# Patient Record
Sex: Female | Born: 1965
Health system: Southern US, Community
[De-identification: ages and names within clinical notes are randomized; demographics above are authoritative.]

## PROBLEM LIST (undated history)

## (undated) DIAGNOSIS — F419 Anxiety disorder, unspecified: Secondary | ICD-10-CM

---

## 1998-09-13 ENCOUNTER — Other Ambulatory Visit: Admission: RE | Admit: 1998-09-13 | Discharge: 1998-09-13 | Payer: Self-pay | Admitting: Obstetrics and Gynecology

## 1998-10-31 ENCOUNTER — Emergency Department (HOSPITAL_COMMUNITY): Admission: EM | Admit: 1998-10-31 | Discharge: 1998-10-31 | Payer: Self-pay | Admitting: Internal Medicine

## 2001-07-24 ENCOUNTER — Emergency Department (HOSPITAL_COMMUNITY): Admission: EM | Admit: 2001-07-24 | Discharge: 2001-07-24 | Payer: Self-pay | Admitting: Emergency Medicine

## 2002-04-30 ENCOUNTER — Other Ambulatory Visit: Admission: RE | Admit: 2002-04-30 | Discharge: 2002-04-30 | Payer: Self-pay | Admitting: Obstetrics and Gynecology

## 2002-07-29 ENCOUNTER — Encounter (INDEPENDENT_AMBULATORY_CARE_PROVIDER_SITE_OTHER): Payer: Self-pay | Admitting: *Deleted

## 2002-07-29 ENCOUNTER — Ambulatory Visit (HOSPITAL_COMMUNITY): Admission: RE | Admit: 2002-07-29 | Discharge: 2002-07-29 | Payer: Self-pay | Admitting: Obstetrics and Gynecology

## 2003-09-06 ENCOUNTER — Emergency Department (HOSPITAL_COMMUNITY): Admission: EM | Admit: 2003-09-06 | Discharge: 2003-09-06 | Payer: Self-pay | Admitting: Emergency Medicine

## 2004-05-10 ENCOUNTER — Other Ambulatory Visit: Admission: RE | Admit: 2004-05-10 | Discharge: 2004-05-10 | Payer: Self-pay | Admitting: Obstetrics and Gynecology

## 2005-05-26 ENCOUNTER — Other Ambulatory Visit: Admission: RE | Admit: 2005-05-26 | Discharge: 2005-05-26 | Payer: Self-pay | Admitting: Obstetrics and Gynecology

## 2005-06-29 ENCOUNTER — Ambulatory Visit: Payer: Self-pay | Admitting: Internal Medicine

## 2005-07-14 ENCOUNTER — Ambulatory Visit: Payer: Self-pay | Admitting: Internal Medicine

## 2005-07-19 ENCOUNTER — Ambulatory Visit: Payer: Self-pay | Admitting: Cardiology

## 2007-03-04 ENCOUNTER — Emergency Department (HOSPITAL_COMMUNITY): Admission: EM | Admit: 2007-03-04 | Discharge: 2007-03-05 | Payer: Self-pay | Admitting: Emergency Medicine

## 2008-01-22 ENCOUNTER — Encounter: Admission: RE | Admit: 2008-01-22 | Discharge: 2008-02-17 | Payer: Self-pay

## 2008-12-21 ENCOUNTER — Ambulatory Visit: Payer: Self-pay | Admitting: Internal Medicine

## 2010-05-18 ENCOUNTER — Encounter (INDEPENDENT_AMBULATORY_CARE_PROVIDER_SITE_OTHER): Payer: Self-pay | Admitting: *Deleted

## 2010-10-01 ENCOUNTER — Encounter: Payer: Self-pay | Admitting: Obstetrics and Gynecology

## 2010-10-11 NOTE — Letter (Signed)
Summary: Pre Visit Letter Revised  [facility]  [address] [place], [place] [address]   Phone: [phone]  Fax: [phone]        05/18/2010 MRN: [id number]   [facility] [address] [place], [place]  [address]              Welcome to the Gastroenterology Division at [facility].    You are scheduled to see a nurse for your pre-procedure visit on June 02, 2010 at 11:00am on the 3rd floor at [facility], [address]  We ask that you try to arrive at our office 15 minutes prior to your appointment time to allow for check-in.  Please take a minute to review the attached form.  If you answer "Yes" to one or more of the questions on the first page, we ask that you call the person listed at your earliest opportunity.  If you answer "No" to all of the questions, please complete the rest of the form and bring it to your appointment.    Your nurse visit will consist of discussing your medical and surgical history, your immediate family medical history, and your medications.   If you are unable to list all of your medications on the form, please bring the medication bottles to your appointment and we will list them.  We will need to be aware of both prescribed and over the counter drugs.  We will need to know exact dosage information as well.    Please be prepared to read and sign documents such as consent forms, a financial agreement, and acknowledgement forms.  If necessary, and with your consent, a friend or relative is welcome to sit-in on the nurse visit with you.  Please bring your insurance card so that we may make a copy of it.  If your insurance requires a referral to see a specialist, please bring your referral form from your primary care physician.  No co-pay is required for this nurse visit.     If you cannot keep your appointment, please call [phone] to cancel or reschedule prior to your appointment date.  This allows [place] the  opportunity to schedule an appointment for another patient in need of care.    Thank you for choosing [facility] for your medical needs.  We appreciate the opportunity to care for you.  Please visit [place] at our website  to learn more about our practice.  Sincerely, The Gastroenterology Division

## 2010-11-07 ENCOUNTER — Ambulatory Visit (INDEPENDENT_AMBULATORY_CARE_PROVIDER_SITE_OTHER): Payer: BC Managed Care – PPO | Admitting: Family Medicine

## 2010-11-07 DIAGNOSIS — M25559 Pain in unspecified hip: Secondary | ICD-10-CM

## 2011-01-06 ENCOUNTER — Encounter (INDEPENDENT_AMBULATORY_CARE_PROVIDER_SITE_OTHER): Payer: BC Managed Care – PPO | Admitting: *Deleted

## 2011-01-06 DIAGNOSIS — R5383 Other fatigue: Secondary | ICD-10-CM

## 2011-01-06 DIAGNOSIS — Z0000 Encounter for general adult medical examination without abnormal findings: Secondary | ICD-10-CM

## 2011-01-06 DIAGNOSIS — R609 Edema, unspecified: Secondary | ICD-10-CM

## 2011-01-06 DIAGNOSIS — R5381 Other malaise: Secondary | ICD-10-CM

## 2011-01-06 DIAGNOSIS — E559 Vitamin D deficiency, unspecified: Secondary | ICD-10-CM

## 2011-01-27 NOTE — Op Note (Signed)
   NAME:  Deborah [name], Deborah [name]                        ACCOUNT NO.:  [id number]   MEDICAL RECORD NO.:  [id number]                   PATIENT TYPE:  AMB   LOCATION:  SDC                                  FACILITY:  [facility]   PHYSICIAN:  Juluis Mire, M.D.                DATE OF BIRTH:  1965-11-13   DATE OF PROCEDURE:  07/29/2002  DATE OF DISCHARGE:                                 OPERATIVE REPORT   PREOPERATIVE DIAGNOSES:  1. Abnormal uterine bleeding.  2. Endometrial polyp.   POSTOPERATIVE DIAGNOSES:  1. Abnormal uterine bleeding.  2. Endometrial polyp.   OPERATIVE PROCEDURE:  1. Cervical dilatation.  2. Hysteroscopy.  3. Resection of polyp.  4. Multiple endometrial biopsies.   SURGEON:  Juluis Mire, M.D.   ANESTHESIA:  General using the LMA.   ESTIMATED BLOOD LOSS:  Minimal.   PACKS AND DRAINS:  None.   INTRAOPERATIVE BLOOD PLACED:  None.   COMPLICATIONS:  None.   INDICATIONS:  Dictated in history and physical.   PROCEDURE AS FOLLOWS:  The patient taken to the OR and placed in supine  position.  After [name] satisfactory level of general anesthesia patient's legs  in [name] dorsal lithotomy position using Allen stirrups, the perineum and vagina  were prepped out with Betadine and draped in [name] sterile field.  Speculum was  placed in the vaginal vault and cervix grasped with single tooth tenaculum.  Uterus sounded to 8 cm.  Cervix serially dilated to [name] size 35 Pratt dilator.  Operative hysteroscope was introduced and intrauterine cavity was distended  using sorbitol.  Visualization revealed probably posteriorly what was [name]  polyp, but had been somewhat disrupted by dilation.  This area was resected  using [name] single loop.  Multiple endometrial biopsies were obtained from the  anterior, posterior, and lateral walls.  No other endometrial pathology was  noted.  There was no signs of perforation or active bleeding.  Single tooth  tenaculum and  speculum then removed.  The patient taken out  of the dorsal lithotomy  position.  Once alert and extubated, transferred to recovery room in good  condition.  Sponge, instrument, needle count reported as correct by  circulating nurse x2.                                               Juluis Mire, M.D.    JSM/MEDQ  D:  07/29/2002  T:  07/29/2002  Job:  [id number]

## 2011-01-27 NOTE — H&P (Signed)
NAME:  Deborah [name], Deborah [name]                        ACCOUNT NO.:  [id number]   MEDICAL RECORD NO.:  [id number]                   PATIENT TYPE:  AMB   LOCATION:  SDC                                  [facility]   PHYSICIAN:  Juluis Mire, M.D.                DATE OF BIRTH:  07-Apr-1966   DATE OF ADMISSION:  07/29/2002  DATE OF DISCHARGE:                                HISTORY & PHYSICAL   HISTORY OF PRESENT ILLNESS:  The patient is [name] 45 year old gravida 2, para 2,  married white female who presents for [name] hysteroscopy with resectoscope.   In relation to the present admission, the patient has been having trouble  with oligomenorrhea.  She presented for saline infusion ultrasound to rule  out endometrial pathology.  [name] large polyp was noted.  She now presents for [name]  hysteroscopic resection.   ALLERGIES:  No known drug allergies.   MEDICATIONS:  Prozac.   PAST MEDICAL HISTORY:  Usual childhood diseases without any significant  sequelae.   PAST SURGICAL HISTORY:  Previous laparoscopic bilateral tubal ligation.  There was no evidence of pelvic pathology at that time.   OBSTETRICAL HISTORY:  She has had two spontaneous vaginal deliveries.   FAMILY HISTORY:  Noncontributory.   SOCIAL HISTORY:  No tobacco or alcohol use.   REVIEW OF SYMPTOMS:  Noncontributory.   PHYSICAL EXAMINATION:  VITAL SIGNS:  The patient is afebrile with stable  vital signs.  HEENT:  The patient is normocephalic.  Pupils equal, round, reactive to  light and accommodation.  Extraocular movements were intact.  Sclerae and  conjunctivae clear.  Oropharynx clear.  NECK:  Without thyromegaly.  BREASTS:  No discrete masses.  LUNGS:  Clear.  CARDIOVASCULAR:  Regular rate and rhythm, no murmurs, rubs, or gallops.  ABDOMEN:  Benign, no masses, organomegaly, or tenderness.  PELVIC:  Normal external genitalia.  Vaginal mucosa clear.  Cervix is  unremarkable.  Uterus is normal size, shape, and contour.  Adnexa are  free  of masses or tenderness.  EXTREMITIES:  Trace edema.  NEUROLOGIC:  Grossly within normal limits.   IMPRESSION:  Abnormal uterine bleeding with evidence of endometrial polyp.   PLAN:  The patient will undergo hysteroscopic evaluation and resection.  The  risks of surgery have been discussed, including the risk of anesthesia.  The  risk of infection.  The risk of vascular injury which could lead to  hemorrhage requiring transfusion or possible hysterectomy.  The risk of  uterine perforation which could lead to injury to bowel requiring  exploratory surgery.  The risk of deep vein thrombosis and pulmonary  embolus.  The patient voiced understanding of indications and risks.  Juluis Mire, M.D.    JSM/MEDQ  D:  07/29/2002  T:  07/29/2002  Job:  [id number]

## 2011-06-28 LAB — URINALYSIS, ROUTINE W REFLEX MICROSCOPIC
Ketones, ur: 15 — AB
Nitrite: POSITIVE — AB
Protein, ur: NEGATIVE

## 2011-06-28 LAB — WET PREP, GENITAL: Trich, Wet Prep: NONE SEEN

## 2011-06-28 LAB — BASIC METABOLIC PANEL
BUN: 14
Chloride: 106
Glucose, Bld: 97
Potassium: 3.3 — ABNORMAL LOW

## 2011-06-28 LAB — DIFFERENTIAL
Eosinophils Absolute: 0.2
Eosinophils Relative: 2
Lymphs Abs: 2.2
Monocytes Absolute: 1 — ABNORMAL HIGH

## 2011-06-28 LAB — CBC
HCT: 36.1
MCV: 90.1
Platelets: 265
WBC: 10.3

## 2011-06-28 LAB — GC/CHLAMYDIA PROBE AMP, GENITAL: GC Probe Amp, Genital: NEGATIVE

## 2011-06-28 LAB — PREGNANCY, URINE: Preg Test, Ur: NEGATIVE

## 2011-06-28 LAB — URINE MICROSCOPIC-ADD ON

## 2011-12-27 ENCOUNTER — Emergency Department (HOSPITAL_COMMUNITY): Payer: BC Managed Care – PPO

## 2011-12-27 ENCOUNTER — Emergency Department (HOSPITAL_COMMUNITY)
Admission: EM | Admit: 2011-12-27 | Discharge: 2011-12-27 | Disposition: A | Discharge status: Home / Self Care | Payer: BC Managed Care – PPO | Attending: Emergency Medicine | Admitting: Emergency Medicine

## 2011-12-27 ENCOUNTER — Encounter (HOSPITAL_COMMUNITY): Payer: Self-pay

## 2011-12-27 DIAGNOSIS — R51 Headache: Secondary | ICD-10-CM | POA: Insufficient documentation

## 2011-12-27 DIAGNOSIS — R1013 Epigastric pain: Secondary | ICD-10-CM

## 2011-12-27 DIAGNOSIS — R110 Nausea: Secondary | ICD-10-CM | POA: Insufficient documentation

## 2011-12-27 HISTORY — DX: Anxiety disorder, unspecified: F41.9

## 2011-12-27 LAB — DIFFERENTIAL
Basophils Absolute: 0.1 10*3/uL (ref 0.0–0.1)
Basophils Relative: 1 % (ref 0–1)
Eosinophils Absolute: 0.2 10*3/uL (ref 0.0–0.7)
Neutro Abs: 3.4 10*3/uL (ref 1.7–7.7)
Neutrophils Relative %: 51 % (ref 43–77)

## 2011-12-27 LAB — URINALYSIS, ROUTINE W REFLEX MICROSCOPIC
Glucose, UA: NEGATIVE mg/dL
Leukocytes, UA: NEGATIVE
Nitrite: NEGATIVE
Protein, ur: NEGATIVE mg/dL
Urobilinogen, UA: 1 mg/dL (ref 0.0–1.0)

## 2011-12-27 LAB — COMPREHENSIVE METABOLIC PANEL
AST: 15 U/L (ref 0–37)
Albumin: 3.6 g/dL (ref 3.5–5.2)
Alkaline Phosphatase: 71 U/L (ref 39–117)
Chloride: 110 mEq/L (ref 96–112)
Potassium: 3.6 mEq/L (ref 3.5–5.1)
Total Bilirubin: 0.2 mg/dL — ABNORMAL LOW (ref 0.3–1.2)
Total Protein: 6.7 g/dL (ref 6.0–8.3)

## 2011-12-27 LAB — LIPASE, BLOOD: Lipase: 27 U/L (ref 11–59)

## 2011-12-27 LAB — CBC
MCH: 30.8 pg (ref 26.0–34.0)
MCHC: 32.7 g/dL (ref 30.0–36.0)
Platelets: 250 10*3/uL (ref 150–400)
RDW: 14 % (ref 11.5–15.5)

## 2011-12-27 LAB — PREGNANCY, URINE: Preg Test, Ur: NEGATIVE

## 2011-12-27 MED ORDER — PANTOPRAZOLE SODIUM 20 MG PO TBEC: 20.0000 mg | DELAYED_RELEASE_TABLET | Freq: Every day | ORAL | Status: DC

## 2011-12-27 MED ORDER — FAMOTIDINE IN NACL 20-0.9 MG/50ML-% IV SOLN
20.0000 mg | Freq: Once | INTRAVENOUS | Status: AC
  Administered 2011-12-27: 20 mg via INTRAVENOUS
  Filled 2011-12-27: qty 50

## 2011-12-27 MED ORDER — FAMOTIDINE 20 MG PO TABS: 20.0000 mg | ORAL_TABLET | Freq: Two times a day (BID) | ORAL | Status: DC

## 2011-12-27 MED ORDER — SODIUM CHLORIDE 0.9 % IV SOLN
Freq: Once | INTRAVENOUS | Status: AC
  Administered 2011-12-27: 07:00:00 via INTRAVENOUS

## 2011-12-27 MED ORDER — DEXTROSE 50 % IV SOLN
1.0000 | Freq: Once | INTRAVENOUS | Status: AC
Start: 2011-12-27 — End: 2011-12-27
  Administered 2011-12-27: 50 mL via INTRAVENOUS
  Filled 2011-12-27: qty 50

## 2011-12-27 MED ORDER — ONDANSETRON 8 MG PO TBDP: 8.0000 mg | ORAL_TABLET | Freq: Three times a day (TID) | ORAL | Status: AC | PRN

## 2011-12-27 MED ORDER — IOHEXOL 300 MG/ML  SOLN
100.0000 mL | Freq: Once | INTRAMUSCULAR | Status: AC | PRN
  Administered 2011-12-27: 100 mL via INTRAVENOUS

## 2011-12-27 MED ORDER — ACETAMINOPHEN 325 MG PO TABS
650.0000 mg | ORAL_TABLET | Freq: Once | ORAL | Status: AC
  Administered 2011-12-27: 650 mg via ORAL
  Filled 2011-12-27: qty 2

## 2011-12-27 MED ORDER — ONDANSETRON HCL 4 MG/2ML IJ SOLN
4.0000 mg | Freq: Once | INTRAMUSCULAR | Status: AC
  Administered 2011-12-27: 4 mg via INTRAVENOUS
  Filled 2011-12-27: qty 2

## 2011-12-27 MED ORDER — GI COCKTAIL ~~LOC~~
30.0000 mL | Freq: Once | ORAL | Status: AC
  Administered 2011-12-27: 30 mL via ORAL
  Filled 2011-12-27: qty 30

## 2011-12-27 NOTE — ED Notes (Signed)
Pt complains of a headache and nausea, worse in the evenings for about one week

## 2011-12-27 NOTE — Discharge Instructions (Signed)
Your labs and CT scan were both normal today. Make sure to avoid fatty and spicy foods. Avoid NSAID medications, alcohol, caffeinated beverages. Take pepcid and protonix as prescirbed for possible reflux. Take zofran for nausea as prescribed as needed. Follow up with gastroenterology for further testing if symptoms continue.   Gastroesophageal Reflux Disease, Adult Gastroesophageal reflux disease (GERD) happens when acid from your stomach flows up into the esophagus. When acid comes in contact with the esophagus, the acid causes soreness (inflammation) in the esophagus. Over time, GERD may create small holes (ulcers) in the lining of the esophagus. CAUSES   Increased body weight. This puts pressure on the stomach, making acid rise from the stomach into the esophagus.   Smoking. This increases acid production in the stomach.   Drinking alcohol. This causes decreased pressure in the lower esophageal sphincter (valve or ring of muscle between the esophagus and stomach), allowing acid from the stomach into the esophagus.   Late evening meals and a full stomach. This increases pressure and acid production in the stomach.   A malformed lower esophageal sphincter.  Sometimes, no cause is found. SYMPTOMS   Burning pain in the lower part of the mid-chest behind the breastbone and in the mid-stomach area. This may occur twice a week or more often.   Trouble swallowing.   Sore throat.   Dry cough.   Asthma-like symptoms including chest tightness, shortness of breath, or wheezing.  DIAGNOSIS  Your caregiver may be able to diagnose GERD based on your symptoms. In some cases, X-rays and other tests may be done to check for complications or to check the condition of your stomach and esophagus. TREATMENT  Your caregiver may recommend over-the-counter or prescription medicines to help decrease acid production. Ask your caregiver before starting or adding any new medicines.  HOME CARE INSTRUCTIONS    Change the factors that you can control. Ask your caregiver for guidance concerning weight loss, quitting smoking, and alcohol consumption.   Avoid foods and drinks that make your symptoms worse, such as:   Caffeine or alcoholic drinks.   Chocolate.   Peppermint or mint flavorings.   Garlic and onions.   Spicy foods.   Citrus fruits, such as oranges, lemons, or limes.   Tomato-based foods such as sauce, chili, salsa, and pizza.   Fried and fatty foods.   Avoid lying down for the 3 hours prior to your bedtime or prior to taking a nap.   Eat small, frequent meals instead of large meals.   Wear loose-fitting clothing. Do not wear anything tight around your waist that causes pressure on your stomach.   Raise the head of your bed 6 to 8 inches with wood blocks to help you sleep. Extra pillows will not help.   Only take over-the-counter or prescription medicines for pain, discomfort, or fever as directed by your caregiver.   Do not take aspirin, ibuprofen, or other nonsteroidal anti-inflammatory drugs (NSAIDs).  SEEK IMMEDIATE MEDICAL CARE IF:   You have pain in your arms, neck, jaw, teeth, or back.   Your pain increases or changes in intensity or duration.   You develop nausea, vomiting, or sweating (diaphoresis).   You develop shortness of breath, or you faint.   Your vomit is green, yellow, black, or looks like coffee grounds or blood.   Your stool is red, bloody, or black.  These symptoms could be signs of other problems, such as heart disease, gastric bleeding, or esophageal bleeding. MAKE SURE YOU:  Understand these instructions.   Will watch your condition.   Will get help right away if you are not doing well or get worse.  Document Released: 06/07/2005 Document Revised: 08/17/2011 Document Reviewed: 03/17/2011 [facility] Patient Information 2012 [place], [place].

## 2011-12-27 NOTE — ED Provider Notes (Signed)
History     CSN: [id number]  Arrival date & time 12/27/11  4098   First MD Initiated Contact with Patient 12/27/11 [phone]      Chief Complaint  Patient presents with  . Nausea  . Headache    (Consider location/radiation/quality/duration/timing/severity/associated sxs/prior treatment) Patient is a 46 y.o. female presenting with abdominal pain. The history is provided by the patient.  Abdominal Pain The primary symptoms of the illness include abdominal pain and nausea. The primary symptoms of the illness do not include fever, fatigue, shortness of breath, vomiting, diarrhea, hematemesis, dysuria or vaginal bleeding. The current episode started more than 2 days ago.  Symptoms associated with the illness do not include chills, constipation, frequency or back pain.  Pt reports nausea and epigastric discomfort for the last month, worse at night time and gradually improving through the day. States she usually eats dinner around 6pm, goes to bed at 9, and states symptoms begin around 10pm and last all night waking her up from sleep constantly. Pt states she is not vomiting. Denies fever, chills, malaise. States now having a headache as well, hx of migraines. No chest pain or SOB. No previous work up. Did not take any medications for this.   Past Medical History  Diagnosis Date  . Migraine   . Anxiety     History reviewed. No pertinent past surgical history.  History reviewed. No pertinent family history.  History  Substance Use Topics  . Smoking status: Not on file  . Smokeless tobacco: Not on file  . Alcohol Use: No    OB History    Grav Para Term Preterm Abortions TAB SAB Ect Mult Living                  Review of Systems  Constitutional: Negative for fever, chills and fatigue.  Eyes: Negative.   Respiratory: Negative for chest tightness and shortness of breath.   Cardiovascular: Negative.   Gastrointestinal: Positive for nausea and abdominal pain. Negative for vomiting,  diarrhea, constipation, blood in stool and hematemesis.  Genitourinary: Negative for dysuria, frequency, flank pain, vaginal bleeding, menstrual problem and pelvic pain.  Musculoskeletal: Negative for back pain.  Skin: Negative.   Neurological: Positive for headaches.  Psychiatric/Behavioral: Negative.     Allergies  Review of patient's allergies indicates no known allergies.  Home Medications   Current Outpatient Rx  Name Route Sig Dispense Refill  . ALPRAZOLAM 0.25 MG PO TABS Oral Take 0.25 mg by mouth at bedtime as needed.    Marland Kitchen CITALOPRAM HYDROBROMIDE 10 MG PO TABS Oral Take 10 mg by mouth daily.    . TOPIRAMATE 100 MG PO TABS Oral Take 300 mg by mouth daily.       BP 117/72  Pulse 69  Temp(Src) 98.7 F (37.1 C) (Oral)  Resp 19  Ht 5\' 6"  (1.676 m)  Wt 170 lb 9.6 oz (77.384 kg)  BMI 27.54 kg/m2  SpO2 100%  LMP 12/27/2011  Physical Exam  Nursing note and vitals reviewed. Constitutional: She appears well-developed and well-nourished.       Uncomfortable appearing  HENT:  Head: Normocephalic and atraumatic.  Neck: Neck supple.  Cardiovascular: Normal rate, regular rhythm and normal heart sounds.   Pulmonary/Chest: Effort normal. No respiratory distress.  Abdominal: Soft. Bowel sounds are normal. She exhibits no distension.       Epigastric and LUQ tenderness. No guarding, no rebound tenderness  Musculoskeletal: Normal range of motion.  Neurological: She is alert.  Skin: Skin  is warm and dry.  Psychiatric: She has a normal mood and affect.    ED Course  Procedures (including critical care time)  PT with nausea for now a month. Epigastric discomfort.Pt belching in ED. Will get labs. Nausea meds and GI cocktail ordered Results for orders placed during the hospital encounter of 12/27/11  URINALYSIS, ROUTINE W REFLEX MICROSCOPIC      Component Value Range   Color, Urine YELLOW  YELLOW    APPearance CLEAR  CLEAR    Specific Gravity, Urine 1.013  1.005 - 1.030    pH  7.0  5.0 - 8.0    Glucose, UA NEGATIVE  NEGATIVE (mg/dL)   Hgb urine dipstick NEGATIVE  NEGATIVE    Bilirubin Urine NEGATIVE  NEGATIVE    Ketones, ur NEGATIVE  NEGATIVE (mg/dL)   Protein, ur NEGATIVE  NEGATIVE (mg/dL)   Urobilinogen, UA 1.0  0.0 - 1.0 (mg/dL)   Nitrite NEGATIVE  NEGATIVE    Leukocytes, UA NEGATIVE  NEGATIVE   PREGNANCY, URINE      Component Value Range   Preg Test, Ur NEGATIVE  NEGATIVE   CBC      Component Value Range   WBC 6.6  4.0 - 10.5 (K/uL)   RBC 4.28  3.87 - 5.11 (MIL/uL)   Hemoglobin 13.2  12.0 - 15.0 (g/dL)   HCT 91.4  78.2 - 95.6 (%)   MCV 94.4  78.0 - 100.0 (fL)   MCH 30.8  26.0 - 34.0 (pg)   MCHC 32.7  30.0 - 36.0 (g/dL)   RDW 21.3  08.6 - 57.8 (%)   Platelets 250  150 - 400 (K/uL)  DIFFERENTIAL      Component Value Range   Neutrophils Relative 51  43 - 77 (%)   Neutro Abs 3.4  1.7 - 7.7 (K/uL)   Lymphocytes Relative 32  12 - 46 (%)   Lymphs Abs 2.1  0.7 - 4.0 (K/uL)   Monocytes Relative 13 (*) 3 - 12 (%)   Monocytes Absolute 0.9  0.1 - 1.0 (K/uL)   Eosinophils Relative 3  0 - 5 (%)   Eosinophils Absolute 0.2  0.0 - 0.7 (K/uL)   Basophils Relative 1  0 - 1 (%)   Basophils Absolute 0.1  0.0 - 0.1 (K/uL)  COMPREHENSIVE METABOLIC PANEL      Component Value Range   Sodium 140  135 - 145 (mEq/L)   Potassium 3.6  3.5 - 5.1 (mEq/L)   Chloride 110  96 - 112 (mEq/L)   CO2 21  19 - 32 (mEq/L)   Glucose, Bld 69 (*) 70 - 99 (mg/dL)   BUN 13  6 - 23 (mg/dL)   Creatinine, Ser 4.69  0.50 - 1.10 (mg/dL)   Calcium 8.8  8.4 - 62.9 (mg/dL)   Total Protein 6.7  6.0 - 8.3 (g/dL)   Albumin 3.6  3.5 - 5.2 (g/dL)   AST 15  0 - 37 (U/L)   ALT 12  0 - 35 (U/L)   Alkaline Phosphatase 71  39 - 117 (U/L)   Total Bilirubin 0.2 (*) 0.3 - 1.2 (mg/dL)   GFR calc non Af Amer 83 (*) >90 (mL/min)   GFR calc Af Amer >90  >90 (mL/min)  LIPASE, BLOOD      Component Value Range   Lipase 27  11 - 59 (U/L)    No significant lab findings to explain pt's symptoms.  Discussed With Dr.Campos. Given symptoms for so long, and pt appears  so uncomfortable, will get CT abd/pelvis to evaluate for possible GI and pancreatic abnormalities.    Ct Abdomen Pelvis W Contrast  12/27/2011  *RADIOLOGY REPORT*  Clinical Data: Nausea and epigastric pain.  CT ABDOMEN AND PELVIS WITH CONTRAST  Technique:  Multidetector CT imaging of the abdomen and pelvis was performed following the standard protocol during bolus administration of intravenous contrast.  Contrast: OMNIPAQUE IOHEXOL 300 MG/ML  SOLN  Comparison: 03/05/2007.  Findings: Lung bases are clear.  Heart size normal.  No pericardial or pleural effusion.  Liver, gallbladder, adrenal glands, kidneys, spleen, pancreas, stomach and bowel are unremarkable.  Appendix is normal.  No pathologically enlarged lymph nodes.  No free fluid.  Uterus and ovaries are visualized.  No worrisome lytic or sclerotic lesions.  IMPRESSION: Negative.  Original Report Authenticated By: Reyes Ivan, M.D.   9:04 AM Pt reassessed. Abdomen continues to be soft. No guarding. Epigastric discomfort on palpation. CT abdomen negative. Pt feeling better. Will start on PPIs, follow up with GI.    No results found.   1. Nausea alone   2. Epigastric pain       MDM          Lottie Mussel, PA 12/27/11 1549

## 2011-12-27 NOTE — ED Notes (Signed)
Discharge instructions reviewed w/ pt., verbalizes understanding. Three prescriptions provided at discharge. 

## 2011-12-28 NOTE — ED Provider Notes (Signed)
Medical screening examination/treatment/procedure(s) were performed by non-physician practitioner and as supervising physician I was immediately available for consultation/collaboration.   Lyanne Co, MD 12/28/11 [phone]

## 2014-04-14 ENCOUNTER — Other Ambulatory Visit: Payer: Self-pay | Admitting: Family Medicine

## 2014-04-14 ENCOUNTER — Ambulatory Visit
Admission: RE | Admit: 2014-04-14 | Discharge: 2014-04-14 | Disposition: A | Discharge status: Home / Self Care | Payer: BC Managed Care – PPO | Source: Ambulatory Visit | Attending: Family Medicine | Admitting: Family Medicine

## 2014-04-14 DIAGNOSIS — M25551 Pain in right hip: Secondary | ICD-10-CM

## 2016-04-14 DIAGNOSIS — R5383 Other fatigue: Secondary | ICD-10-CM | POA: Diagnosis not present

## 2016-04-14 DIAGNOSIS — R351 Nocturia: Secondary | ICD-10-CM | POA: Diagnosis not present

## 2016-04-14 DIAGNOSIS — R52 Pain, unspecified: Secondary | ICD-10-CM | POA: Diagnosis not present

## 2016-04-14 DIAGNOSIS — R635 Abnormal weight gain: Secondary | ICD-10-CM | POA: Diagnosis not present

## 2016-04-27 DIAGNOSIS — M542 Cervicalgia: Secondary | ICD-10-CM | POA: Diagnosis not present

## 2016-04-27 DIAGNOSIS — M791 Myalgia: Secondary | ICD-10-CM | POA: Diagnosis not present

## 2016-04-27 DIAGNOSIS — G518 Other disorders of facial nerve: Secondary | ICD-10-CM | POA: Diagnosis not present

## 2016-04-27 DIAGNOSIS — G43019 Migraine without aura, intractable, without status migrainosus: Secondary | ICD-10-CM | POA: Diagnosis not present

## 2016-04-28 DIAGNOSIS — M722 Plantar fascial fibromatosis: Secondary | ICD-10-CM | POA: Diagnosis not present

## 2016-04-28 DIAGNOSIS — M71571 Other bursitis, not elsewhere classified, right ankle and foot: Secondary | ICD-10-CM | POA: Diagnosis not present

## 2016-05-05 DIAGNOSIS — M71571 Other bursitis, not elsewhere classified, right ankle and foot: Secondary | ICD-10-CM | POA: Diagnosis not present

## 2016-05-05 DIAGNOSIS — M722 Plantar fascial fibromatosis: Secondary | ICD-10-CM | POA: Diagnosis not present

## 2016-08-11 DIAGNOSIS — M545 Low back pain: Secondary | ICD-10-CM | POA: Diagnosis not present

## 2016-10-11 DIAGNOSIS — Z1231 Encounter for screening mammogram for malignant neoplasm of breast: Secondary | ICD-10-CM | POA: Diagnosis not present

## 2016-10-11 DIAGNOSIS — Z6838 Body mass index (BMI) 38.0-38.9, adult: Secondary | ICD-10-CM | POA: Diagnosis not present

## 2016-10-11 DIAGNOSIS — Z01419 Encounter for gynecological examination (general) (routine) without abnormal findings: Secondary | ICD-10-CM | POA: Diagnosis not present

## 2016-10-12 ENCOUNTER — Other Ambulatory Visit: Payer: Self-pay | Admitting: Obstetrics

## 2016-10-12 ENCOUNTER — Other Ambulatory Visit: Payer: Self-pay | Admitting: Obstetrics and Gynecology

## 2016-10-12 DIAGNOSIS — N63 Unspecified lump in unspecified breast: Secondary | ICD-10-CM

## 2016-10-16 ENCOUNTER — Other Ambulatory Visit: Payer: Self-pay

## 2016-10-17 DIAGNOSIS — E079 Disorder of thyroid, unspecified: Secondary | ICD-10-CM | POA: Diagnosis not present

## 2016-10-17 DIAGNOSIS — R42 Dizziness and giddiness: Secondary | ICD-10-CM | POA: Diagnosis not present

## 2016-10-17 DIAGNOSIS — F331 Major depressive disorder, recurrent, moderate: Secondary | ICD-10-CM | POA: Diagnosis not present

## 2016-10-17 DIAGNOSIS — Z6838 Body mass index (BMI) 38.0-38.9, adult: Secondary | ICD-10-CM | POA: Diagnosis not present

## 2016-10-19 DIAGNOSIS — N6320 Unspecified lump in the left breast, unspecified quadrant: Secondary | ICD-10-CM | POA: Diagnosis not present

## 2016-11-03 DIAGNOSIS — Z79899 Other long term (current) drug therapy: Secondary | ICD-10-CM | POA: Diagnosis not present

## 2016-11-03 DIAGNOSIS — Z0000 Encounter for general adult medical examination without abnormal findings: Secondary | ICD-10-CM | POA: Diagnosis not present

## 2016-11-07 DIAGNOSIS — Z0000 Encounter for general adult medical examination without abnormal findings: Secondary | ICD-10-CM | POA: Diagnosis not present

## 2016-11-07 DIAGNOSIS — F331 Major depressive disorder, recurrent, moderate: Secondary | ICD-10-CM | POA: Diagnosis not present

## 2016-11-07 DIAGNOSIS — E782 Mixed hyperlipidemia: Secondary | ICD-10-CM | POA: Diagnosis not present

## 2016-11-07 DIAGNOSIS — E079 Disorder of thyroid, unspecified: Secondary | ICD-10-CM | POA: Diagnosis not present

## 2016-11-18 DIAGNOSIS — H9203 Otalgia, bilateral: Secondary | ICD-10-CM | POA: Diagnosis not present

## 2016-11-21 DIAGNOSIS — M71571 Other bursitis, not elsewhere classified, right ankle and foot: Secondary | ICD-10-CM | POA: Diagnosis not present

## 2016-11-21 DIAGNOSIS — M722 Plantar fascial fibromatosis: Secondary | ICD-10-CM | POA: Diagnosis not present

## 2016-11-29 DIAGNOSIS — R1011 Right upper quadrant pain: Secondary | ICD-10-CM | POA: Diagnosis not present

## 2016-11-30 ENCOUNTER — Other Ambulatory Visit (HOSPITAL_COMMUNITY): Payer: Self-pay | Admitting: Internal Medicine

## 2016-11-30 DIAGNOSIS — R109 Unspecified abdominal pain: Secondary | ICD-10-CM

## 2016-11-30 DIAGNOSIS — R1011 Right upper quadrant pain: Secondary | ICD-10-CM

## 2016-12-01 ENCOUNTER — Ambulatory Visit (HOSPITAL_COMMUNITY)
Admission: RE | Admit: 2016-12-01 | Discharge: 2016-12-01 | Disposition: A | Discharge status: Home / Self Care | Payer: BLUE CROSS/BLUE SHIELD | Source: Ambulatory Visit | Attending: Internal Medicine | Admitting: Internal Medicine

## 2016-12-01 DIAGNOSIS — R1011 Right upper quadrant pain: Secondary | ICD-10-CM | POA: Diagnosis not present

## 2016-12-01 DIAGNOSIS — R109 Unspecified abdominal pain: Secondary | ICD-10-CM | POA: Insufficient documentation

## 2016-12-06 DIAGNOSIS — R110 Nausea: Secondary | ICD-10-CM | POA: Diagnosis not present

## 2016-12-06 DIAGNOSIS — R109 Unspecified abdominal pain: Secondary | ICD-10-CM | POA: Diagnosis not present

## 2017-01-26 ENCOUNTER — Encounter: Payer: Self-pay | Admitting: Internal Medicine

## 2017-01-26 DIAGNOSIS — M71571 Other bursitis, not elsewhere classified, right ankle and foot: Secondary | ICD-10-CM | POA: Diagnosis not present

## 2017-01-26 DIAGNOSIS — M722 Plantar fascial fibromatosis: Secondary | ICD-10-CM | POA: Diagnosis not present

## 2017-04-28 DIAGNOSIS — M25561 Pain in right knee: Secondary | ICD-10-CM | POA: Diagnosis not present

## 2017-05-22 DIAGNOSIS — M25572 Pain in left ankle and joints of left foot: Secondary | ICD-10-CM | POA: Diagnosis not present

## 2017-05-22 DIAGNOSIS — M25562 Pain in left knee: Secondary | ICD-10-CM | POA: Diagnosis not present

## 2017-06-06 DIAGNOSIS — M791 Myalgia: Secondary | ICD-10-CM | POA: Diagnosis not present

## 2017-06-06 DIAGNOSIS — M542 Cervicalgia: Secondary | ICD-10-CM | POA: Diagnosis not present

## 2017-06-06 DIAGNOSIS — G518 Other disorders of facial nerve: Secondary | ICD-10-CM | POA: Diagnosis not present

## 2017-06-06 DIAGNOSIS — G43019 Migraine without aura, intractable, without status migrainosus: Secondary | ICD-10-CM | POA: Diagnosis not present

## 2017-07-26 DIAGNOSIS — M659 Synovitis and tenosynovitis, unspecified: Secondary | ICD-10-CM | POA: Diagnosis not present

## 2017-07-26 DIAGNOSIS — M722 Plantar fascial fibromatosis: Secondary | ICD-10-CM | POA: Diagnosis not present

## 2017-07-26 DIAGNOSIS — M71572 Other bursitis, not elsewhere classified, left ankle and foot: Secondary | ICD-10-CM | POA: Diagnosis not present

## 2017-07-26 DIAGNOSIS — M71571 Other bursitis, not elsewhere classified, right ankle and foot: Secondary | ICD-10-CM | POA: Diagnosis not present

## 2017-08-06 DIAGNOSIS — E079 Disorder of thyroid, unspecified: Secondary | ICD-10-CM | POA: Diagnosis not present

## 2017-08-06 DIAGNOSIS — E782 Mixed hyperlipidemia: Secondary | ICD-10-CM | POA: Diagnosis not present

## 2017-08-06 DIAGNOSIS — Z79899 Other long term (current) drug therapy: Secondary | ICD-10-CM | POA: Diagnosis not present

## 2017-08-06 DIAGNOSIS — R51 Headache: Secondary | ICD-10-CM | POA: Diagnosis not present

## 2017-08-13 DIAGNOSIS — M71571 Other bursitis, not elsewhere classified, right ankle and foot: Secondary | ICD-10-CM | POA: Diagnosis not present

## 2017-08-13 DIAGNOSIS — M722 Plantar fascial fibromatosis: Secondary | ICD-10-CM | POA: Diagnosis not present

## 2017-08-17 DIAGNOSIS — M791 Myalgia, unspecified site: Secondary | ICD-10-CM | POA: Diagnosis not present

## 2017-08-17 DIAGNOSIS — E079 Disorder of thyroid, unspecified: Secondary | ICD-10-CM | POA: Diagnosis not present

## 2017-09-26 DIAGNOSIS — Z01812 Encounter for preprocedural laboratory examination: Secondary | ICD-10-CM | POA: Diagnosis not present

## 2017-09-26 DIAGNOSIS — M722 Plantar fascial fibromatosis: Secondary | ICD-10-CM | POA: Diagnosis not present

## 2017-10-12 HISTORY — PX: OTHER SURGICAL HISTORY: SHX169

## 2017-10-16 DIAGNOSIS — Z1231 Encounter for screening mammogram for malignant neoplasm of breast: Secondary | ICD-10-CM | POA: Diagnosis not present

## 2017-10-16 DIAGNOSIS — N951 Menopausal and female climacteric states: Secondary | ICD-10-CM | POA: Diagnosis not present

## 2017-10-16 DIAGNOSIS — Z01419 Encounter for gynecological examination (general) (routine) without abnormal findings: Secondary | ICD-10-CM | POA: Diagnosis not present

## 2017-10-16 DIAGNOSIS — E079 Disorder of thyroid, unspecified: Secondary | ICD-10-CM | POA: Diagnosis not present

## 2017-10-16 DIAGNOSIS — Z6841 Body Mass Index (BMI) 40.0 and over, adult: Secondary | ICD-10-CM | POA: Diagnosis not present

## 2017-10-17 DIAGNOSIS — M722 Plantar fascial fibromatosis: Secondary | ICD-10-CM | POA: Diagnosis not present

## 2017-10-22 ENCOUNTER — Other Ambulatory Visit: Payer: Self-pay | Admitting: Obstetrics and Gynecology

## 2017-10-22 DIAGNOSIS — R928 Other abnormal and inconclusive findings on diagnostic imaging of breast: Secondary | ICD-10-CM

## 2017-10-24 DIAGNOSIS — M722 Plantar fascial fibromatosis: Secondary | ICD-10-CM | POA: Diagnosis not present

## 2017-10-30 ENCOUNTER — Ambulatory Visit
Admission: RE | Admit: 2017-10-30 | Discharge: 2017-10-30 | Disposition: A | Discharge status: Home / Self Care | Payer: BLUE CROSS/BLUE SHIELD | Source: Ambulatory Visit | Attending: Obstetrics and Gynecology | Admitting: Obstetrics and Gynecology

## 2017-10-30 DIAGNOSIS — R928 Other abnormal and inconclusive findings on diagnostic imaging of breast: Secondary | ICD-10-CM | POA: Diagnosis not present

## 2017-10-30 DIAGNOSIS — N6002 Solitary cyst of left breast: Secondary | ICD-10-CM | POA: Diagnosis not present

## 2017-11-12 DIAGNOSIS — E782 Mixed hyperlipidemia: Secondary | ICD-10-CM | POA: Diagnosis not present

## 2017-11-12 DIAGNOSIS — M791 Myalgia, unspecified site: Secondary | ICD-10-CM | POA: Diagnosis not present

## 2017-11-12 DIAGNOSIS — E079 Disorder of thyroid, unspecified: Secondary | ICD-10-CM | POA: Diagnosis not present

## 2017-11-12 DIAGNOSIS — F331 Major depressive disorder, recurrent, moderate: Secondary | ICD-10-CM | POA: Diagnosis not present

## 2017-12-19 DIAGNOSIS — M722 Plantar fascial fibromatosis: Secondary | ICD-10-CM | POA: Diagnosis not present

## 2018-01-04 DIAGNOSIS — D175 Benign lipomatous neoplasm of intra-abdominal organs: Secondary | ICD-10-CM | POA: Diagnosis not present

## 2018-01-04 DIAGNOSIS — K5730 Diverticulosis of large intestine without perforation or abscess without bleeding: Secondary | ICD-10-CM | POA: Diagnosis not present

## 2018-01-04 DIAGNOSIS — K633 Ulcer of intestine: Secondary | ICD-10-CM | POA: Diagnosis not present

## 2018-01-04 DIAGNOSIS — K635 Polyp of colon: Secondary | ICD-10-CM | POA: Diagnosis not present

## 2018-01-04 DIAGNOSIS — Z1211 Encounter for screening for malignant neoplasm of colon: Secondary | ICD-10-CM | POA: Diagnosis not present

## 2018-01-08 DIAGNOSIS — Z1211 Encounter for screening for malignant neoplasm of colon: Secondary | ICD-10-CM | POA: Diagnosis not present

## 2018-01-08 DIAGNOSIS — K635 Polyp of colon: Secondary | ICD-10-CM | POA: Diagnosis not present

## 2018-02-27 NOTE — Progress Notes (Signed)
Cardiology Office Note   Date:  02/28/2018   ID:  Deborah [name], DOB 1966-04-06, MRN [id number]  PCP:  Deborah Stabile, MD  Cardiologist:   Deborah Haws, MD   No chief complaint on file.     History of Present Illness: Deborah [name] is a 52 y.o. female who presents for consultation regarding risk of CAD. 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 Sedentary and had foot surgery in February still with some pain Denies SSCP, palpitations or syncope  Some exertional dyspnea that seems functional     Past Medical History:  Diagnosis Date  . Anxiety   . Migraine     Past Surgical History:  Procedure Laterality Date  . right foot surgery  10/2017     Current Outpatient Medications  Medication Sig Dispense Refill  . ALPRAZolam (XANAX) 0.25 MG tablet Take 0.25 mg by mouth at bedtime as needed.     No current facility-administered medications for this visit.     Allergies:   Patient has no known allergies.    Social History:  The patient  reports that she has never smoked. She has never used smokeless tobacco. She reports that she does not drink alcohol or use drugs.   Family History:  The patient's family history includes Dementia in her mother; Diabetes in her mother; Heart attack in her brother; High blood pressure in her father; Transient ischemic attack in her mother.    ROS:  Please see the history of present illness.   Otherwise, review of systems are positive for none.   All other systems are reviewed and negative.    PHYSICAL EXAM: VS:  BP 122/74 (BP Location: Right Arm)   Pulse 79   Ht 5\' 7"  (1.702 m)   Wt 284 lb (128.8 kg)   SpO2 91%   BMI 44.48 kg/m  , BMI Body mass index is 44.48 kg/m. Affect  appropriate Healthy:  appears stated age HEENT: normal Neck supple with no adenopathy JVP normal no bruits no thyromegaly Lungs clear with no wheezing and good diaphragmatic motion Heart:  S1/S2 no murmur, no rub, gallop or click PMI normal Abdomen: benighn, BS positve, no tenderness, no AAA no bruit.  No HSM or HJR Distal pulses intact with no bruits No edema Neuro non-focal Skin warm and dry No muscular weakness    EKG:  NSR poor R wave progression non specific ST changes    Recent Labs: No results found for requested labs within last 8760 hours.    Lipid Panel No results found for: CHOL, TRIG, HDL, CHOLHDL, VLDL, LDLCALC, LDLDIRECT    Wt Readings from Last 3 Encounters:  02/28/18 284 lb (128.8 kg)  12/27/11 170 lb 9.6 oz (77.4 kg)      Other studies Reviewed: Additional studies/ records that were reviewed today include: Notes from primary .    ASSESSMENT AND PLAN:  1.  CAD Risk no symptoms recommended coronary calcium score at [facility] office If score is 0 will not need baseline stress testing  2. Weight Loss:  Discouraged use of phentermine vs diet and exercise 3. Thyroid: continue replacement f/u TSH primary  4. GERD: discussed low carb diet continue pepcid and protonix 5. Anxiety/Depression: using combination of  Celexa and xanax f/u primary     Current medicines are reviewed at length with the patient today.  The patient does not have concerns regarding medicines.  The following changes have been made:  no change  Labs/ tests ordered today include: Coronary Calcium Score   Orders Placed This Encounter  Procedures  . CT CARDIAC SCORING  . EKG 12-Lead     Disposition:   FU with cardiology PRN      Signed, Deborah HawsPeter Nishan, MD  02/28/2018 2:56 PM    [facility] [address], [place]  [address] Phone: [phone]; Fax: [phone]

## 2018-02-28 ENCOUNTER — Ambulatory Visit: Payer: BLUE CROSS/BLUE SHIELD | Admitting: Cardiovascular Disease

## 2018-02-28 ENCOUNTER — Encounter: Payer: Self-pay | Admitting: Cardiovascular Disease

## 2018-02-28 VITALS — BP 122/74 | HR 79 | Ht 67.0 in | Wt 284.0 lb

## 2018-02-28 DIAGNOSIS — Z8249 Family history of ischemic heart disease and other diseases of the circulatory system: Secondary | ICD-10-CM

## 2018-02-28 NOTE — Patient Instructions (Signed)
Medication Instructions:  Your physician recommends that you continue on your current medications as directed. Please refer to the Current Medication list given to you today.   Labwork: NONE  Testing/Procedures: CALCIUM SCORING @ [address] [facility]   Follow-Up: Your physician recommends that you schedule a follow-up appointment in: as needed    Any Other Special Instructions Will Be Listed Below (If Applicable).     If you need a refill on your cardiac medications before your next appointment, please call your pharmacy.

## 2018-04-04 DIAGNOSIS — R1084 Generalized abdominal pain: Secondary | ICD-10-CM | POA: Diagnosis not present

## 2018-04-04 DIAGNOSIS — Z6837 Body mass index (BMI) 37.0-37.9, adult: Secondary | ICD-10-CM | POA: Diagnosis not present

## 2018-04-04 DIAGNOSIS — R1011 Right upper quadrant pain: Secondary | ICD-10-CM | POA: Diagnosis not present

## 2018-04-04 DIAGNOSIS — R509 Fever, unspecified: Secondary | ICD-10-CM | POA: Diagnosis not present

## 2018-04-12 ENCOUNTER — Other Ambulatory Visit: Payer: Self-pay | Admitting: Internal Medicine

## 2018-04-12 DIAGNOSIS — R109 Unspecified abdominal pain: Secondary | ICD-10-CM

## 2018-04-18 ENCOUNTER — Ambulatory Visit (HOSPITAL_COMMUNITY): Admission: RE | Admit: 2018-04-18 | Payer: BLUE CROSS/BLUE SHIELD | Source: Ambulatory Visit

## 2018-06-14 DIAGNOSIS — M722 Plantar fascial fibromatosis: Secondary | ICD-10-CM | POA: Diagnosis not present

## 2018-07-29 DIAGNOSIS — M545 Low back pain: Secondary | ICD-10-CM | POA: Diagnosis not present

## 2018-07-29 DIAGNOSIS — R1031 Right lower quadrant pain: Secondary | ICD-10-CM | POA: Diagnosis not present

## 2018-08-28 ENCOUNTER — Ambulatory Visit: Payer: BLUE CROSS/BLUE SHIELD | Admitting: Podiatry

## 2018-08-28 ENCOUNTER — Ambulatory Visit (INDEPENDENT_AMBULATORY_CARE_PROVIDER_SITE_OTHER): Payer: BLUE CROSS/BLUE SHIELD

## 2018-08-28 ENCOUNTER — Encounter: Payer: Self-pay | Admitting: Podiatry

## 2018-08-28 ENCOUNTER — Other Ambulatory Visit: Payer: Self-pay | Admitting: Podiatry

## 2018-08-28 DIAGNOSIS — M79672 Pain in left foot: Secondary | ICD-10-CM

## 2018-08-28 DIAGNOSIS — M722 Plantar fascial fibromatosis: Secondary | ICD-10-CM | POA: Diagnosis not present

## 2018-08-28 DIAGNOSIS — M79671 Pain in right foot: Secondary | ICD-10-CM

## 2018-08-28 NOTE — Progress Notes (Signed)
Subjective:   Patient ID: Deborah NeptunePaula A [name], female   DOB: 52 y.o.   MRN: [id number]   HPI Patient presents stating she had surgery on her right heel and she knows she needs surgery on her left heel.  States that they did not release and not for the fascia right and there is still some pain on the outside but it is improved from before surgery and the left heel is intensely sore.  Patient has had injection therapy immobilization orthotics anti-inflammatories night splint and physical therapy without relief.  Patient does not smoke likes to be active   Review of Systems  All other systems reviewed and are negative.       Objective:  Physical Exam Vitals signs and nursing note reviewed.  Constitutional:      Appearance: She is well-developed.  Pulmonary:     Effort: Pulmonary effort is normal.  Musculoskeletal: Normal range of motion.  Skin:    General: Skin is warm.  Neurological:     Mental Status: She is alert.     Neurovascular status intact muscle strength is adequate range of motion within normal limits with patient noted to have exquisite discomfort plantar fascial left heel and central and medial band with mild pain on the lateral band.  The right heel is not sore medial with mild pain lateral and there has been previous surgery done on it.  Patient has severe flatfoot deformity bilateral patient noted to have good digital perfusion and is well oriented x3     Assessment:  Acute plantar fasciitis left failing to respond to numerous conservative treatments with history of surgery on the right     Plan:  H&P conditions reviewed and discussed at great length.  I do think surgery on the left one is necessary and she wants to get this done and wants to do in January.  I also discussed new orthotics which I think will be of benefit to her but surgery is given to be her treatment of choice for this left heel with hopefully avoiding any more surgery on the right one.  I allowed her to  read consent form line by line going over ample opportunities of questions and explained we will do the middle central and possible part of the lateral or the entire fascia depending on how it feels.  Patient wants surgery understanding everything is outlined is willing to accept risk and is scheduled for outpatient surgery at the current time and does understand the total recovery will take 6 months to 1 year and there is no long-term guarantees as to the recovery process.  Patient is encouraged to call with questions and is scheduled for January for surgery  X-rays indicate plantar spur bilateral with significant flattening of the arch noted bilateral

## 2018-08-28 NOTE — Patient Instructions (Signed)
Pre-Operative Instructions  Congratulations, you have decided to take an important step towards improving your quality of life.  You can be assured that the doctors and staff at [facility] will be with you every step of the way.  Here are some important things you should know:  1. Plan to be at the surgery center/hospital at least 1 (one) hour prior to your scheduled time, unless otherwise directed by the surgical center/hospital staff.  You must have a responsible adult accompany you, remain during the surgery and drive you home.  Make sure you have directions to the surgical center/hospital to ensure you arrive on time. 2. If you are having surgery at [facility] or [facility], you will need a copy of your medical history and physical form from your family physician within one month prior to the date of surgery. We will give you a form for your primary physician to complete.  3. We make every effort to accommodate the date you request for surgery.  However, there are times where surgery dates or times have to be moved.  We will contact you as soon as possible if a change in schedule is required.   4. No aspirin/ibuprofen for one week before surgery.  If you are on aspirin, any non-steroidal anti-inflammatory medications (Mobic, Aleve, Ibuprofen) should not be taken seven (7) days prior to your surgery.  You make take Tylenol for pain prior to surgery.  5. Medications - If you are taking daily heart and blood pressure medications, seizure, reflux, allergy, asthma, anxiety, pain or diabetes medications, make sure you notify the surgery center/hospital before the day of surgery so they can tell you which medications you should take or avoid the day of surgery. 6. No food or drink after midnight the night before surgery unless directed otherwise by surgical center/hospital staff. 7. No alcoholic beverages 24-hours prior to surgery.  No smoking 24-hours prior or 24-hours after  surgery. 8. Wear loose pants or shorts. They should be loose enough to fit over bandages, boots, and casts. 9. Don't wear slip-on shoes. Sneakers are preferred. 10. Bring your boot with you to the surgery center/hospital.  Also bring crutches or a walker if your physician has prescribed it for you.  If you do not have this equipment, it will be provided for you after surgery. 11. If you have not been contacted by the surgery center/hospital by the day before your surgery, call to confirm the date and time of your surgery. 12. Leave-time from work may vary depending on the type of surgery you have.  Appropriate arrangements should be made prior to surgery with your employer. 13. Prescriptions will be provided immediately following surgery by your doctor.  Fill these as soon as possible after surgery and take the medication as directed. Pain medications will not be refilled on weekends and must be approved by the doctor. 14. Remove nail polish on the operative foot and avoid getting pedicures prior to surgery. 15. Wash the night before surgery.  The night before surgery wash the foot and leg well with water and the antibacterial soap provided. Be sure to pay special attention to beneath the toenails and in between the toes.  Wash for at least three (3) minutes. Rinse thoroughly with water and dry well with a towel.  Perform this wash unless told not to do so by your physician.  Enclosed: 1 Ice pack (please put in freezer the night before surgery)   1 Hibiclens skin cleaner     Pre-op instructions  If you have any questions regarding the instructions, please do not hesitate to call our office.  [place]: [address], [place], [place] [address] -- [phone]  [place]: [address] , [place] [address] -- [phone]  [place]: [address]  [place], [place] [address] -- [phone]  [place]: [address], [address], [place],  [address] -- [phone]  Website:  [email or web address] 

## 2018-08-29 ENCOUNTER — Telehealth: Payer: Self-pay | Admitting: *Deleted

## 2018-08-29 NOTE — Telephone Encounter (Signed)
"  I called and left a message this morning.  I"m scheduled for surgery on January 28 and I want to see if Dr. Charlsie Merlesegal has anything available for February 18.  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 Charlsie Merlesegal can do it on February 18.  I will get it rescheduled.  "Thank you so much."

## 2018-09-10 ENCOUNTER — Telehealth (HOSPITAL_COMMUNITY): Payer: Self-pay | Admitting: Emergency Medicine

## 2018-09-10 ENCOUNTER — Other Ambulatory Visit: Payer: Self-pay

## 2018-09-10 ENCOUNTER — Ambulatory Visit (HOSPITAL_COMMUNITY)
Admission: EM | Admit: 2018-09-10 | Discharge: 2018-09-10 | Disposition: A | Discharge status: Home / Self Care | Payer: BLUE CROSS/BLUE SHIELD | Attending: Family Medicine | Admitting: Family Medicine

## 2018-09-10 ENCOUNTER — Encounter (HOSPITAL_COMMUNITY): Payer: Self-pay | Admitting: Emergency Medicine

## 2018-09-10 DIAGNOSIS — R05 Cough: Secondary | ICD-10-CM | POA: Insufficient documentation

## 2018-09-10 DIAGNOSIS — J0110 Acute frontal sinusitis, unspecified: Secondary | ICD-10-CM | POA: Diagnosis not present

## 2018-09-10 DIAGNOSIS — R059 Cough, unspecified: Secondary | ICD-10-CM

## 2018-09-10 MED ORDER — BENZONATATE 100 MG PO CAPS: 100.0000 mg | ORAL_CAPSULE | Freq: Three times a day (TID) | ORAL | 0 refills | Status: DC

## 2018-09-10 MED ORDER — AMOXICILLIN-POT CLAVULANATE 875-125 MG PO TABS: 1.0000 | ORAL_TABLET | Freq: Two times a day (BID) | ORAL | 0 refills | Status: AC

## 2018-09-10 NOTE — ED Provider Notes (Addendum)
[facility]   [id number] 09/10/18 Arrival Time: [id number]   CC: URI symptoms   SUBJECTIVE: History from: patient.  Deborah [name] is a 52 y.o. female who presents with nasal congestion, runny nose, sinus pain/pressure, PND, and cough x 2 weeks.  Admits to positive sick exposure to coworkers with similar symptoms.  Has tried OTC medications with temporary relief.  Denies aggravating factors.  Reports previous symptoms in the past. Complains of subjective fever, chills, and fatigue Denies sore throat, SOB, wheezing, chest pain, nausea, changes in bowel or bladder habits.    Received flu shot this year: yes.  ROS: As per HPI.  Past Medical History:  Diagnosis Date  . Anxiety   . Migraine    Past Surgical History:  Procedure Laterality Date  . right foot surgery  10/2017   No Known Allergies No current facility-administered medications on file prior to encounter.    Current Outpatient Medications on File Prior to Encounter  Medication Sig Dispense Refill  . guaiFENesin (ROBITUSSIN) 100 MG/5ML SOLN Take 5 mLs by mouth every 4 (four) hours as needed for cough or to loosen phlegm.    . Pseudoeph-Doxylamine-DM-APAP (NYQUIL PO) Take by mouth.    . Pseudoephedrine-APAP-DM (DAYQUIL PO) Take by mouth.     Social History   Socioeconomic History  . Marital status: Single    Spouse name: Not on file  . Number of children: Not on file  . Years of education: Not on file  . Highest education level: Not on file  Occupational History  . Not on file  Social Needs  . Financial resource strain: Not on file  . Food insecurity:    Worry: Not on file    Inability: Not on file  . Transportation needs:    Medical: Not on file    Non-medical: Not on file  Tobacco Use  . Smoking status: Never Smoker  . Smokeless tobacco: Never Used  Substance and Sexual Activity  . Alcohol use: No  . Drug use: Never  . Sexual activity: Not on file  Lifestyle  . Physical activity:    Days per  week: Not on file    Minutes per session: Not on file  . Stress: Not on file  Relationships  . Social connections:    Talks on phone: Not on file    Gets together: Not on file    Attends religious service: Not on file    Active member of club or organization: Not on file    Attends meetings of clubs or organizations: Not on file    Relationship status: Not on file  . Intimate partner violence:    Fear of current or ex partner: Not on file    Emotionally abused: Not on file    Physically abused: Not on file    Forced sexual activity: Not on file  Other Topics Concern  . Not on file  Social History Narrative  . Not on file   Family History  Problem Relation Age of Onset  . Diabetes Mother   . Transient ischemic attack Mother   . Dementia Mother   . High blood pressure Father   . Heart attack Brother     OBJECTIVE:  Vitals:   09/10/18 0903  BP: 134/83  Pulse: 81  Resp: 18  Temp: 98.6 F (37 C)  TempSrc: Oral  SpO2: 97%     General appearance: alert; appears fatigued, but nontoxic; speaking in full sentences and tolerating own secretions HEENT:  NCAT; Ears: EACs clear, TMs pearly gray; Eyes: PERRL.  EOM grossly intact. Sinuses: frontal tenderness; Nose: nares patent without rhinorrhea, turbinates swollen and erythematous Throat: oropharynx clear, tonsils non erythematous or enlarged, uvula midline  Neck: supple without LAD Lungs: unlabored respirations, symmetrical air entry; cough: mild; no respiratory distress; CTAB Heart: regular rate and rhythm.  Radial pulses 2+ symmetrical bilaterally Skin: warm and dry Psychological: alert and cooperative; normal mood and affect  ASSESSMENT & PLAN:  1. Acute non-recurrent frontal sinusitis   2. Cough     Meds ordered this encounter  Medications  . amoxicillin-clavulanate (AUGMENTIN) 875-125 MG tablet    Sig: Take 1 tablet by mouth every 12 (twelve) hours for 10 days.    Dispense:  20 tablet    Refill:  0    Order  Specific Question:   Supervising Provider    Answer:   Eustace MooreNELSON, YVONNE SUE [[id number]]  . DISCONTD: benzonatate (TESSALON) 100 MG capsule    Sig: Take 1 capsule (100 mg total) by mouth every 8 (eight) hours.    Dispense:  21 capsule    Refill:  0    Order Specific Question:   Supervising Provider    Answer:   Eustace MooreELSON, YVONNE SUE [[id number]]    Rest and push fluids Use OTC zyrtec and/or flonase as needed for symptomatic relief Augmentin prescribed.  Take as directed and to completion Continue with OTC ibuprofen/tylenol as needed for pain Follow up with PCP or [facility] if symptoms persists Return or go to the ED if you have any new or worsening symptoms such as fever, chills, worsening sinus pain/pressure, cough, sore throat, chest pain, shortness of breath, abdominal pain, changes in bowel or bladder habits, etc...   Reviewed expectations re: course of current medical issues. Questions answered. Outlined signs and symptoms indicating need for more acute intervention. Patient verbalized understanding. After Visit Summary given.         Rennis HardingWurst, Viktor Philipp, PA-C 09/10/18 1009    Alvino ChapelWurst, [place], [place] JerseyPA-C 09/10/18 1105

## 2018-09-10 NOTE — ED Triage Notes (Signed)
Patient had cold symptoms 2 weeks ago.  Now has headache, cough, chest soreness with cough.  Denies fever recently

## 2018-09-10 NOTE — Discharge Instructions (Signed)
Rest and push fluids Use OTC zyrtec and/or flonase as needed for symptomatic relief Augmentin prescribed.  Take as directed and to completion Continue with OTC ibuprofen/tylenol as needed for pain Follow up with PCP or [facility] if symptoms persists Return or go to the ED if you have any new or worsening symptoms such as fever, chills, worsening sinus pain/pressure, cough, sore throat, chest pain, shortness of breath, abdominal pain, changes in bowel or bladder habits, etc...Marland Kitchen

## 2018-09-26 DIAGNOSIS — E079 Disorder of thyroid, unspecified: Secondary | ICD-10-CM | POA: Diagnosis not present

## 2018-09-26 DIAGNOSIS — E782 Mixed hyperlipidemia: Secondary | ICD-10-CM | POA: Diagnosis not present

## 2018-10-04 DIAGNOSIS — E782 Mixed hyperlipidemia: Secondary | ICD-10-CM | POA: Diagnosis not present

## 2018-10-04 DIAGNOSIS — Z0001 Encounter for general adult medical examination with abnormal findings: Secondary | ICD-10-CM | POA: Diagnosis not present

## 2018-10-04 DIAGNOSIS — R002 Palpitations: Secondary | ICD-10-CM | POA: Diagnosis not present

## 2018-10-04 DIAGNOSIS — E079 Disorder of thyroid, unspecified: Secondary | ICD-10-CM | POA: Diagnosis not present

## 2018-10-11 ENCOUNTER — Telehealth: Payer: Self-pay

## 2018-10-11 NOTE — Telephone Encounter (Signed)
SENT REFERRAL TO SCHEDULING AND FILED NOTES 

## 2018-10-29 ENCOUNTER — Other Ambulatory Visit: Payer: Self-pay | Admitting: Obstetrics and Gynecology

## 2018-10-29 ENCOUNTER — Encounter: Payer: Self-pay | Admitting: Podiatry

## 2018-10-29 DIAGNOSIS — Z1231 Encounter for screening mammogram for malignant neoplasm of breast: Secondary | ICD-10-CM

## 2018-10-29 DIAGNOSIS — Z01818 Encounter for other preprocedural examination: Secondary | ICD-10-CM | POA: Diagnosis not present

## 2018-10-29 DIAGNOSIS — M722 Plantar fascial fibromatosis: Secondary | ICD-10-CM | POA: Diagnosis not present

## 2018-10-31 ENCOUNTER — Ambulatory Visit (INDEPENDENT_AMBULATORY_CARE_PROVIDER_SITE_OTHER): Payer: BLUE CROSS/BLUE SHIELD | Admitting: Podiatry

## 2018-10-31 ENCOUNTER — Telehealth: Payer: Self-pay | Admitting: Podiatry

## 2018-10-31 DIAGNOSIS — M722 Plantar fascial fibromatosis: Secondary | ICD-10-CM

## 2018-10-31 NOTE — Telephone Encounter (Signed)
I called pt and she states she marked the area on the dressing and it has bleed out of the marked area and is still damp. I encouraged pt not to be up on the surgery foot more than 15 minutes/hour and to be in the surgery shoe at all times. I offered pt an appt today to have the evaluated by Tomasa Hose, RN for possible redressing or reinforcing of the current dressing. Pt accepted offer and is transferred to scheduler.

## 2018-10-31 NOTE — Telephone Encounter (Signed)
I had surgery with Dr. Charlsie Merles on Tuesday. The surgical center called to check on me yesterday and I told them there had been bleeding and was told to mark the spot and if continued to call your office. Its bleeding more now. If you could please give me a call back.

## 2018-10-31 NOTE — Progress Notes (Signed)
Patient presents today, s/p EPF Lt DOS 10/29/2018. She is concerned about bleeding through her bandage.   Noted small amount of blood on the lateral side of her bandage. Removed bandages to check incision. All sutures intact, no active bleeding. Re-bandaged with DSD and compression.

## 2018-11-06 ENCOUNTER — Ambulatory Visit (INDEPENDENT_AMBULATORY_CARE_PROVIDER_SITE_OTHER): Payer: BLUE CROSS/BLUE SHIELD | Admitting: Podiatry

## 2018-11-06 ENCOUNTER — Encounter: Payer: Self-pay | Admitting: Podiatry

## 2018-11-06 DIAGNOSIS — M722 Plantar fascial fibromatosis: Secondary | ICD-10-CM

## 2018-11-06 DIAGNOSIS — Z09 Encounter for follow-up examination after completed treatment for conditions other than malignant neoplasm: Secondary | ICD-10-CM

## 2018-11-06 NOTE — Progress Notes (Signed)
Subjective:   Patient ID: Deborah [name], female   DOB: 53 y.o.   MRN: [id number]   HPI Patient states overall doing very well with minimal discomfort   ROS      Objective:  Physical Exam  Neurovascular status intact with healed left healing well with wound edges well coapted and in good alignment with no indications of pathology     Assessment:  Doing well post endoscopic surgery left heel     Plan:  Reviewed dressing finding there to be good apposition of the incisions with wound edges well coapted and applied sterile dressing instructed on continued boot usage with gradual surgical shoe usage and reappoint several weeks to reevaluate

## 2018-11-08 ENCOUNTER — Ambulatory Visit (INDEPENDENT_AMBULATORY_CARE_PROVIDER_SITE_OTHER): Payer: BLUE CROSS/BLUE SHIELD | Admitting: Podiatry

## 2018-11-08 ENCOUNTER — Encounter: Payer: Self-pay | Admitting: Podiatry

## 2018-11-08 ENCOUNTER — Telehealth: Payer: Self-pay | Admitting: Podiatry

## 2018-11-08 VITALS — BP 120/74 | HR 68 | Resp 16

## 2018-11-08 DIAGNOSIS — Z09 Encounter for follow-up examination after completed treatment for conditions other than malignant neoplasm: Secondary | ICD-10-CM

## 2018-11-08 NOTE — Telephone Encounter (Signed)
I was in this past week for my first postop visit. I told them when I stepped it hurt and there was blood. Well this morning I stepped of the curb and felt a really bad sharp pain and there was bleeding from within. Please call me back at [phone] and leave me a message if I don't answer as I'm at work.

## 2018-11-08 NOTE — Telephone Encounter (Signed)
I spoke with Pam at pt's employment she states pt is on the way to an appt with our office.

## 2018-11-08 NOTE — Telephone Encounter (Signed)
Left message for pt to come to the office for an 11:30am appt.

## 2018-11-09 NOTE — Progress Notes (Signed)
Subjective:   Patient ID: Deborah [name], female   DOB: 53 y.o.   MRN: [id number]   HPI Patient was worried that there may be some bleeding from 1 of her stitches and she wanted it checked   ROS      Objective:  Physical Exam  Neurovascular status intact negative Homans sign noted with patient's left incision site lateral showing a slight amount of irritation that is local with no breakdown of tissue or other pathology     Assessment:  Seems to be doing well with no indications of pathology     Plan:  Reapplied bandaging and dressings and reappoint for a regular visit or earlier if any issues should occur

## 2018-11-12 DIAGNOSIS — R002 Palpitations: Secondary | ICD-10-CM | POA: Diagnosis not present

## 2018-11-12 DIAGNOSIS — F331 Major depressive disorder, recurrent, moderate: Secondary | ICD-10-CM | POA: Diagnosis not present

## 2018-11-12 DIAGNOSIS — F411 Generalized anxiety disorder: Secondary | ICD-10-CM | POA: Diagnosis not present

## 2018-11-15 ENCOUNTER — Encounter: Payer: Self-pay | Admitting: Podiatry

## 2018-11-15 ENCOUNTER — Ambulatory Visit (INDEPENDENT_AMBULATORY_CARE_PROVIDER_SITE_OTHER): Payer: BLUE CROSS/BLUE SHIELD | Admitting: Podiatry

## 2018-11-15 DIAGNOSIS — Z09 Encounter for follow-up examination after completed treatment for conditions other than malignant neoplasm: Secondary | ICD-10-CM

## 2018-11-15 DIAGNOSIS — M722 Plantar fascial fibromatosis: Secondary | ICD-10-CM

## 2018-11-17 NOTE — Progress Notes (Signed)
Subjective:   Patient ID: Deborah [name], female   DOB: 53 y.o.   MRN: [id number]   HPI Patient states doing well after surgery   ROS      Objective:  Physical Exam  Neurovascular status intact negative Homans sign noted patient's left foot is healed well wound edges well coapted and minimal plantar pain noted     Assessment:  Doing well post endoscopic surgery left heel     Plan:  Sterile dressing reapplied and instructed on anti-inflammatories continued elevation and continue boot usage.  Stitches are removed and dressings applied to these areas

## 2019-01-29 DIAGNOSIS — K1370 Unspecified lesions of oral mucosa: Secondary | ICD-10-CM | POA: Diagnosis not present

## 2019-02-13 ENCOUNTER — Encounter: Payer: Self-pay | Admitting: Podiatry

## 2019-02-13 ENCOUNTER — Other Ambulatory Visit: Payer: Self-pay

## 2019-02-13 ENCOUNTER — Ambulatory Visit: Payer: BLUE CROSS/BLUE SHIELD | Admitting: Podiatry

## 2019-02-13 VITALS — Temp 98.2°F

## 2019-02-13 DIAGNOSIS — L600 Ingrowing nail: Secondary | ICD-10-CM | POA: Diagnosis not present

## 2019-02-13 DIAGNOSIS — M722 Plantar fascial fibromatosis: Secondary | ICD-10-CM | POA: Diagnosis not present

## 2019-02-13 MED ORDER — NEOMYCIN-POLYMYXIN-HC 3.5-10000-1 OT SOLN: OTIC | 0 refills | Status: DC

## 2019-02-13 NOTE — Patient Instructions (Addendum)
Pre-Operative Instructions  Congratulations, you have decided to take an important step towards improving your quality of life.  You can be assured that the doctors and staff at [facility] will be with you every step of the way.  Here are some important things you should know:  1. Plan to be at the surgery center/hospital at least 1 (one) hour prior to your scheduled time, unless otherwise directed by the surgical center/hospital staff.  You must have a responsible adult accompany you, remain during the surgery and drive you home.  Make sure you have directions to the surgical center/hospital to ensure you arrive on time. 2. If you are having surgery at [facility] or [facility], you will need a copy of your medical history and physical form from your family physician within one month prior to the date of surgery. We will give you a form for your primary physician to complete.  3. We make every effort to accommodate the date you request for surgery.  However, there are times where surgery dates or times have to be moved.  We will contact you as soon as possible if a change in schedule is required.   4. No aspirin/ibuprofen for one week before surgery.  If you are on aspirin, any non-steroidal anti-inflammatory medications (Mobic, Aleve, Ibuprofen) should not be taken seven (7) days prior to your surgery.  You make take Tylenol for pain prior to surgery.  5. Medications - If you are taking daily heart and blood pressure medications, seizure, reflux, allergy, asthma, anxiety, pain or diabetes medications, make sure you notify the surgery center/hospital before the day of surgery so they can tell you which medications you should take or avoid the day of surgery. 6. No food or drink after midnight the night before surgery unless directed otherwise by surgical center/hospital staff. 7. No alcoholic beverages 24-hours prior to surgery.  No smoking 24-hours prior or 24-hours after  surgery. 8. Wear loose pants or shorts. They should be loose enough to fit over bandages, boots, and casts. 9. Don't wear slip-on shoes. Sneakers are preferred. 10. Bring your boot with you to the surgery center/hospital.  Also bring crutches or a walker if your physician has prescribed it for you.  If you do not have this equipment, it will be provided for you after surgery. 11. If you have not been contacted by the surgery center/hospital by the day before your surgery, call to confirm the date and time of your surgery. 12. Leave-time from work may vary depending on the type of surgery you have.  Appropriate arrangements should be made prior to surgery with your employer. 13. Prescriptions will be provided immediately following surgery by your doctor.  Fill these as soon as possible after surgery and take the medication as directed. Pain medications will not be refilled on weekends and must be approved by the doctor. 14. Remove nail polish on the operative foot and avoid getting pedicures prior to surgery. 15. Wash the night before surgery.  The night before surgery wash the foot and leg well with water and the antibacterial soap provided. Be sure to pay special attention to beneath the toenails and in between the toes.  Wash for at least three (3) minutes. Rinse thoroughly with water and dry well with a towel.  Perform this wash unless told not to do so by your physician.  Enclosed: 1 Ice pack (please put in freezer the night before surgery)   1 Hibiclens skin cleaner     Pre-op instructions  If you have any questions regarding the instructions, please do not hesitate to call our office.  [place]: [address] [place], [place] [address] -- [phone]  [place]: [address], [place], [place] [address] -- [phone]  [place]: [address], [place] [address] -- [phone]  [place]: [address], [address], [place], [place] [address] -- [phone]  Website:  [email or web address]   Soak Instructions    THE DAY AFTER THE PROCEDURE  Place 1/4 cup of epsom salts in a quart of warm tap water.  Submerge your foot or feet with outer bandage intact for the initial soak; this will allow the bandage to become moist and wet for easy lift off.  Once you remove your bandage, continue to soak in the solution for 20 minutes.  This soak should be done twice a day.  Next, remove your foot or feet from solution, blot dry the affected area and cover.  You may use a band aid large enough to cover the area or use gauze and tape.  Apply other medications to the area as directed by the doctor such as polysporin neosporin.  IF YOUR SKIN BECOMES IRRITATED WHILE USING THESE INSTRUCTIONS, IT IS OKAY TO SWITCH TO  WHITE VINEGAR AND WATER. Or you may use antibacterial soap and water to keep the toe clean  Monitor for any signs/symptoms of infection. Call the office immediately if any occur or go directly to the emergency room. Call with any questions/concerns.    Long Term Care Instructions-Post Nail Surgery  You have had your ingrown toenail and root treated with a chemical.  This chemical causes a burn that will drain and ooze like a blister.  This can drain for 6-8 weeks or longer.  It is important to keep this area clean, covered, and follow the soaking instructions dispensed at the time of your surgery.  This area will eventually dry and form a scab.  Once the scab forms you no longer need to soak or apply a dressing.  If at any time you experience an increase in pain, redness, swelling, or drainage, you should contact the office as soon as possible.

## 2019-02-13 NOTE — Progress Notes (Signed)
Subjective:   Patient ID: Deborah [name], female   DOB: 53 y.o.   MRN: [id number]   HPI Patient presents stating her left heel is doing great but the outside of her bottom right heel is been very sore where it was never released and she also gets pain on the outside of her foot.  Patient also complains of an ingrown toenail right big toe stating that she is tried to trim it and soak it herself without relief of symptoms   ROS      Objective:  Physical Exam  Neurovascular status intact with significant improvement left plantar fasciectomy endoscopic release and induced intense discomfort in the center and lateral aspect of the right plantar fascia with also moderate discomfort in the outside of the right foot and incurvated nail border right hallux lateral border that is painful with no redness drainage noted     Assessment:  Chronic plantar fasciitis with moderate peroneal tendinitis right with ingrown toenail deformity right hallux lateral border     Plan:  H&P reviewed conditions and discussed separately.  For the chronic pain in the heel endoscopic release central lateral band recommended patient wants surgery and I allowed her to read consent form going over alternative treatments complications.  She understands all risk and the fact this is revisional surgery and there is no guarantee this will solve the problem and she wants procedure and signed consent form after extensive review.  I then went ahead discussed correction of the ingrown toenail she wants this fixed understanding procedure and signed consent form today.  I infiltrated the right hallux 60 mg like Marcaine mixture remove lateral border exposed matrix and applied phenol 3 applications 30 seconds followed by alcohol lavage and sterile dressing.  Gave instructions on soaks and reappoint and also wrote prescription for drops.  Patient was casted for functional orthotics long-term due to release of fascia bilateral and chronic  tendinitis

## 2019-02-18 DIAGNOSIS — Z01419 Encounter for gynecological examination (general) (routine) without abnormal findings: Secondary | ICD-10-CM | POA: Diagnosis not present

## 2019-02-18 DIAGNOSIS — Z13820 Encounter for screening for osteoporosis: Secondary | ICD-10-CM | POA: Diagnosis not present

## 2019-02-18 DIAGNOSIS — Z6841 Body Mass Index (BMI) 40.0 and over, adult: Secondary | ICD-10-CM | POA: Diagnosis not present

## 2019-02-19 ENCOUNTER — Telehealth: Payer: Self-pay | Admitting: *Deleted

## 2019-02-19 NOTE — Telephone Encounter (Signed)
DOS 02/25/2019; CPT CODE: 16109 - ENDOSCOPIC PLANTAR FASCIOTOMY CENTRAL AND LATERAL BAND  RIGHT FOOT  BCBS: Policy Effective : 60/45/4098 - 09/10/9998     In-Network    Max Per Benefit Period Year-to-Date Remaining  CoInsurance  20%    Deductible  $1500.00 $0.00  Out-Of-Pocket 3  $4000.00 $1498.08    In Network  [employer] Coinsurance Authorization Required  Not Applicable  11% per Service Year  No

## 2019-02-24 ENCOUNTER — Ambulatory Visit: Payer: BLUE CROSS/BLUE SHIELD | Admitting: Orthotics

## 2019-02-24 ENCOUNTER — Other Ambulatory Visit: Payer: Self-pay

## 2019-02-24 DIAGNOSIS — M722 Plantar fascial fibromatosis: Secondary | ICD-10-CM

## 2019-02-24 NOTE — Progress Notes (Signed)
Patient came in today to pick up custom made foot orthotics.  The goals were accomplished and the patient reported no dissatisfaction with said orthotics.  Patient was advised of breakin period and how to report any issues. 

## 2019-02-25 ENCOUNTER — Encounter: Payer: Self-pay | Admitting: Podiatry

## 2019-02-25 DIAGNOSIS — M722 Plantar fascial fibromatosis: Secondary | ICD-10-CM | POA: Diagnosis not present

## 2019-02-25 DIAGNOSIS — Z008 Encounter for other general examination: Secondary | ICD-10-CM | POA: Diagnosis not present

## 2019-02-26 ENCOUNTER — Telehealth: Payer: Self-pay | Admitting: *Deleted

## 2019-02-26 DIAGNOSIS — M722 Plantar fascial fibromatosis: Secondary | ICD-10-CM | POA: Diagnosis not present

## 2019-02-26 NOTE — Telephone Encounter (Signed)
Pt states she had surgery on 02/25/2019 and her foot is bleeding not as much as yesterday, and her other foot was done in February and it did also, she doesn't feel uncomfortable adding additional padding like with surgery foot in February.

## 2019-02-26 NOTE — Telephone Encounter (Signed)
I called pt and asked if she was still bleeding and she states she felt it is drying. I asked pt if she had marked the area and she stated she had forgotten. I told pt that she could come to the office and we could reinforce the dressing or if she felt comfortable she could. Pt states she felt she could take care of the reinforcing.

## 2019-03-05 ENCOUNTER — Other Ambulatory Visit: Payer: Self-pay

## 2019-03-05 ENCOUNTER — Ambulatory Visit (INDEPENDENT_AMBULATORY_CARE_PROVIDER_SITE_OTHER): Payer: BC Managed Care – PPO | Admitting: Podiatry

## 2019-03-05 ENCOUNTER — Encounter: Payer: Self-pay | Admitting: Podiatry

## 2019-03-05 VITALS — Temp 98.2°F

## 2019-03-05 DIAGNOSIS — M722 Plantar fascial fibromatosis: Secondary | ICD-10-CM | POA: Diagnosis not present

## 2019-03-05 DIAGNOSIS — Z09 Encounter for follow-up examination after completed treatment for conditions other than malignant neoplasm: Secondary | ICD-10-CM

## 2019-03-09 NOTE — Progress Notes (Signed)
Subjective:   Patient ID: Deborah [name], female   DOB: 53 y.o.   MRN: [id number]   HPI Patient states overall I am doing pretty well with minimal discomfort and it does seem that is helping   ROS      Objective:  Physical Exam  Neuro vascular status intact negative Homans sign noted wound edges well coapted with release of the central lateral band with no longer the intense discomfort in the plantar heel     Assessment:  Fasciitis which seems to be improving right central and lateral heel     Plan:  Reviewed continued immobilization elevation compression and went ahead today and reapplied sterile dressing and reappoint 2 weeks for suture removal or earlier if needed

## 2019-03-11 ENCOUNTER — Telehealth: Payer: Self-pay

## 2019-03-11 NOTE — Telephone Encounter (Signed)
Informed pt that neosporin is okay to use, but that if she feels it doesn't improve or looks infected to please give [place] a call so that she can be seen at the office. Advised pt to call by Thursday morning this week.

## 2019-03-13 ENCOUNTER — Ambulatory Visit (INDEPENDENT_AMBULATORY_CARE_PROVIDER_SITE_OTHER): Payer: BC Managed Care – PPO | Admitting: Podiatry

## 2019-03-13 ENCOUNTER — Telehealth: Payer: Self-pay | Admitting: Podiatry

## 2019-03-13 ENCOUNTER — Other Ambulatory Visit: Payer: Self-pay

## 2019-03-13 ENCOUNTER — Telehealth: Payer: Self-pay

## 2019-03-13 VITALS — Temp 98.2°F

## 2019-03-13 DIAGNOSIS — M79672 Pain in left foot: Secondary | ICD-10-CM

## 2019-03-13 DIAGNOSIS — M722 Plantar fascial fibromatosis: Secondary | ICD-10-CM

## 2019-03-13 MED ORDER — AZITHROMYCIN 250 MG PO TABS: ORAL_TABLET | ORAL | 0 refills | Status: DC

## 2019-03-13 NOTE — Progress Notes (Signed)
Subjective:   Patient ID: Deborah [name], female   DOB: 53 y.o.   MRN: [id number]   HPI Patient states overall feels a lot better in her heel but she is getting a little bit of discomfort around the incision site on the lateral side   ROS      Objective:  Physical Exam  Neurovascular status intact with patient's right foot healing well wound edges well coapted no drainage but there is some slight redness around the incision site lateral side     Assessment:  Doing well with plantar fascial release of the central and lateral band of the fascia with previous release of the medial band     Plan:  Reviewed continued stretching due to the release of the entire band and I did dispense a night splint as I think this will be easier for her.  I then went ahead and took the stitches out wound edges coapted well and as precautionary measure placed her on a Z-Pak and gave her strict instructions of any redness swelling were to occur to let us know immediately.  Continue compression immobilization and reappoint to recheck

## 2019-03-13 NOTE — Telephone Encounter (Signed)
Larene Beach from [facility] called about diagnosis code for patient prescription Azithromycin (Zpak) prescribed from office visit today. She stated prescription can not be filled until diagnosis code is given.

## 2019-03-25 ENCOUNTER — Ambulatory Visit
Admission: RE | Admit: 2019-03-25 | Discharge: 2019-03-25 | Disposition: A | Discharge status: Home / Self Care | Payer: BC Managed Care – PPO | Source: Ambulatory Visit | Attending: Obstetrics and Gynecology | Admitting: Obstetrics and Gynecology

## 2019-03-25 DIAGNOSIS — Z1231 Encounter for screening mammogram for malignant neoplasm of breast: Secondary | ICD-10-CM | POA: Diagnosis not present

## 2019-05-05 DIAGNOSIS — E079 Disorder of thyroid, unspecified: Secondary | ICD-10-CM | POA: Diagnosis not present

## 2019-05-05 DIAGNOSIS — E782 Mixed hyperlipidemia: Secondary | ICD-10-CM | POA: Diagnosis not present

## 2019-05-23 DIAGNOSIS — F411 Generalized anxiety disorder: Secondary | ICD-10-CM | POA: Diagnosis not present

## 2019-05-23 DIAGNOSIS — E079 Disorder of thyroid, unspecified: Secondary | ICD-10-CM | POA: Diagnosis not present

## 2019-05-23 DIAGNOSIS — E782 Mixed hyperlipidemia: Secondary | ICD-10-CM | POA: Diagnosis not present

## 2019-05-23 DIAGNOSIS — R002 Palpitations: Secondary | ICD-10-CM | POA: Diagnosis not present

## 2019-06-19 ENCOUNTER — Ambulatory Visit: Payer: BC Managed Care – PPO | Admitting: Podiatry

## 2019-06-19 ENCOUNTER — Other Ambulatory Visit: Payer: Self-pay

## 2019-06-19 ENCOUNTER — Encounter: Payer: Self-pay | Admitting: Podiatry

## 2019-06-19 ENCOUNTER — Ambulatory Visit (INDEPENDENT_AMBULATORY_CARE_PROVIDER_SITE_OTHER): Payer: BC Managed Care – PPO

## 2019-06-19 DIAGNOSIS — M7671 Peroneal tendinitis, right leg: Secondary | ICD-10-CM

## 2019-06-19 DIAGNOSIS — M722 Plantar fascial fibromatosis: Secondary | ICD-10-CM | POA: Diagnosis not present

## 2019-06-19 NOTE — Progress Notes (Signed)
Subjective:   Patient ID: Deborah [name], female   DOB: 53 y.o.   MRN: [id number]   HPI Patient states the bottom of the heel seems to be doing pretty well with mild discomfort and patient is having a lot of discomfort in the outside of the right heel and states that this is just been doing this over the last few weeks and that her son is getting married this weekend   ROS      Objective:  Physical Exam  Neurovascular status intact with patient found to have quite a bit of discomfort in the peroneal tendon as it comes under the lateral malleolus right with pain and no indication of dysfunction of the tendon with mild plantar heel pain still noted     Assessment:  Appears to be a peroneal tendinitis right which may have been secondary to changes in gait during healing process     Plan:  H&P conditions reviewed and today I did careful sheath injection right 3 mg Kenalog 5 mg Xylocaine and advised on ice therapy and utilizing her fascial brace.  Reappoint for [place] to recheck again 3 weeks or earlier if needed  X-ray indicates that there is no indications of bone pathology outside of the foot for other bone pathology

## 2019-06-26 ENCOUNTER — Other Ambulatory Visit: Payer: Self-pay

## 2019-06-26 DIAGNOSIS — Z20822 Contact with and (suspected) exposure to covid-19: Secondary | ICD-10-CM

## 2019-06-26 DIAGNOSIS — Z20828 Contact with and (suspected) exposure to other viral communicable diseases: Secondary | ICD-10-CM | POA: Diagnosis not present

## 2019-06-27 ENCOUNTER — Encounter: Payer: Self-pay | Admitting: Gastroenterology

## 2019-06-27 LAB — NOVEL CORONAVIRUS, NAA: SARS-CoV-2, NAA: NOT DETECTED

## 2019-07-14 DIAGNOSIS — Z209 Contact with and (suspected) exposure to unspecified communicable disease: Secondary | ICD-10-CM | POA: Diagnosis not present

## 2019-09-10 DIAGNOSIS — R05 Cough: Secondary | ICD-10-CM | POA: Diagnosis not present

## 2019-10-09 DIAGNOSIS — E782 Mixed hyperlipidemia: Secondary | ICD-10-CM | POA: Diagnosis not present

## 2019-10-09 DIAGNOSIS — E079 Disorder of thyroid, unspecified: Secondary | ICD-10-CM | POA: Diagnosis not present

## 2019-10-13 DIAGNOSIS — E782 Mixed hyperlipidemia: Secondary | ICD-10-CM | POA: Diagnosis not present

## 2019-10-13 DIAGNOSIS — Z0001 Encounter for general adult medical examination with abnormal findings: Secondary | ICD-10-CM | POA: Diagnosis not present

## 2019-10-13 DIAGNOSIS — R002 Palpitations: Secondary | ICD-10-CM | POA: Diagnosis not present

## 2019-10-13 DIAGNOSIS — E079 Disorder of thyroid, unspecified: Secondary | ICD-10-CM | POA: Diagnosis not present

## 2020-02-21 ENCOUNTER — Inpatient Hospital Stay
Admission: RE | Admit: 2020-02-21 | Discharge: 2020-02-21 | Disposition: A | Discharge status: Home / Self Care | Payer: BC Managed Care – PPO | Source: Ambulatory Visit

## 2020-02-24 DIAGNOSIS — G43719 Chronic migraine without aura, intractable, without status migrainosus: Secondary | ICD-10-CM | POA: Diagnosis not present

## 2020-02-24 DIAGNOSIS — Z79899 Other long term (current) drug therapy: Secondary | ICD-10-CM | POA: Diagnosis not present

## 2020-02-24 DIAGNOSIS — Z049 Encounter for examination and observation for unspecified reason: Secondary | ICD-10-CM | POA: Diagnosis not present

## 2020-02-27 DIAGNOSIS — G43809 Other migraine, not intractable, without status migrainosus: Secondary | ICD-10-CM | POA: Diagnosis not present

## 2020-02-27 DIAGNOSIS — R1084 Generalized abdominal pain: Secondary | ICD-10-CM | POA: Diagnosis not present

## 2020-02-27 DIAGNOSIS — Z6841 Body Mass Index (BMI) 40.0 and over, adult: Secondary | ICD-10-CM | POA: Diagnosis not present

## 2020-02-27 DIAGNOSIS — R1011 Right upper quadrant pain: Secondary | ICD-10-CM | POA: Diagnosis not present

## 2020-03-01 ENCOUNTER — Other Ambulatory Visit (HOSPITAL_COMMUNITY): Payer: Self-pay | Admitting: Internal Medicine

## 2020-03-01 ENCOUNTER — Other Ambulatory Visit: Payer: Self-pay | Admitting: Internal Medicine

## 2020-03-01 DIAGNOSIS — R1084 Generalized abdominal pain: Secondary | ICD-10-CM

## 2020-03-01 DIAGNOSIS — R110 Nausea: Secondary | ICD-10-CM

## 2020-03-03 DIAGNOSIS — Z1231 Encounter for screening mammogram for malignant neoplasm of breast: Secondary | ICD-10-CM | POA: Diagnosis not present

## 2020-03-04 ENCOUNTER — Ambulatory Visit (HOSPITAL_COMMUNITY)
Admission: RE | Admit: 2020-03-04 | Discharge: 2020-03-04 | Disposition: A | Discharge status: Home / Self Care | Payer: BC Managed Care – PPO | Source: Ambulatory Visit | Attending: Internal Medicine | Admitting: Internal Medicine

## 2020-03-04 ENCOUNTER — Other Ambulatory Visit: Payer: Self-pay

## 2020-03-04 DIAGNOSIS — R110 Nausea: Secondary | ICD-10-CM | POA: Diagnosis not present

## 2020-03-04 DIAGNOSIS — R1084 Generalized abdominal pain: Secondary | ICD-10-CM | POA: Insufficient documentation

## 2020-03-04 DIAGNOSIS — K760 Fatty (change of) liver, not elsewhere classified: Secondary | ICD-10-CM | POA: Diagnosis not present

## 2020-03-08 ENCOUNTER — Encounter: Payer: Self-pay | Admitting: Gastroenterology

## 2020-03-08 DIAGNOSIS — R1011 Right upper quadrant pain: Secondary | ICD-10-CM | POA: Diagnosis not present

## 2020-03-08 DIAGNOSIS — E782 Mixed hyperlipidemia: Secondary | ICD-10-CM | POA: Diagnosis not present

## 2020-03-08 DIAGNOSIS — E875 Hyperkalemia: Secondary | ICD-10-CM | POA: Diagnosis not present

## 2020-03-08 DIAGNOSIS — R1031 Right lower quadrant pain: Secondary | ICD-10-CM | POA: Diagnosis not present

## 2020-03-08 DIAGNOSIS — R110 Nausea: Secondary | ICD-10-CM | POA: Diagnosis not present

## 2020-03-08 DIAGNOSIS — R197 Diarrhea, unspecified: Secondary | ICD-10-CM | POA: Diagnosis not present

## 2020-03-08 DIAGNOSIS — E079 Disorder of thyroid, unspecified: Secondary | ICD-10-CM | POA: Diagnosis not present

## 2020-03-12 DIAGNOSIS — N200 Calculus of kidney: Secondary | ICD-10-CM | POA: Diagnosis not present

## 2020-03-12 DIAGNOSIS — R160 Hepatomegaly, not elsewhere classified: Secondary | ICD-10-CM | POA: Diagnosis not present

## 2020-03-31 DIAGNOSIS — Z20822 Contact with and (suspected) exposure to covid-19: Secondary | ICD-10-CM | POA: Diagnosis not present

## 2020-03-31 DIAGNOSIS — Z03818 Encounter for observation for suspected exposure to other biological agents ruled out: Secondary | ICD-10-CM | POA: Diagnosis not present

## 2020-05-04 ENCOUNTER — Other Ambulatory Visit: Payer: Self-pay | Admitting: Gastroenterology

## 2020-05-04 ENCOUNTER — Ambulatory Visit: Payer: BC Managed Care – PPO | Admitting: Gastroenterology

## 2020-05-04 ENCOUNTER — Other Ambulatory Visit (HOSPITAL_COMMUNITY): Payer: Self-pay | Admitting: Gastroenterology

## 2020-05-04 DIAGNOSIS — R112 Nausea with vomiting, unspecified: Secondary | ICD-10-CM | POA: Diagnosis not present

## 2020-05-04 DIAGNOSIS — R1011 Right upper quadrant pain: Secondary | ICD-10-CM

## 2020-05-04 DIAGNOSIS — K760 Fatty (change of) liver, not elsewhere classified: Secondary | ICD-10-CM | POA: Diagnosis not present

## 2020-05-10 ENCOUNTER — Other Ambulatory Visit (HOSPITAL_COMMUNITY): Payer: BC Managed Care – PPO

## 2020-06-04 ENCOUNTER — Encounter (HOSPITAL_COMMUNITY)
Admission: RE | Admit: 2020-06-04 | Discharge: 2020-06-04 | Disposition: A | Discharge status: Home / Self Care | Payer: BC Managed Care – PPO | Source: Ambulatory Visit | Attending: Gastroenterology | Admitting: Gastroenterology

## 2020-06-04 ENCOUNTER — Other Ambulatory Visit: Payer: Self-pay

## 2020-06-04 DIAGNOSIS — R1011 Right upper quadrant pain: Secondary | ICD-10-CM | POA: Diagnosis not present

## 2020-06-04 DIAGNOSIS — R112 Nausea with vomiting, unspecified: Secondary | ICD-10-CM

## 2020-06-04 MED ORDER — TECHNETIUM TC 99M MEBROFENIN IV KIT
5.4000 | PACK | Freq: Once | INTRAVENOUS | Status: AC | PRN
  Administered 2020-06-04: 5.4 via INTRAVENOUS

## 2020-10-15 ENCOUNTER — Other Ambulatory Visit: Payer: BC Managed Care – PPO

## 2020-10-15 DIAGNOSIS — Z20822 Contact with and (suspected) exposure to covid-19: Secondary | ICD-10-CM | POA: Diagnosis not present

## 2020-10-16 LAB — SARS-COV-2, NAA 2 DAY TAT

## 2020-10-16 LAB — NOVEL CORONAVIRUS, NAA: SARS-CoV-2, NAA: DETECTED — AB

## 2020-10-17 ENCOUNTER — Telehealth: Payer: BC Managed Care – PPO | Admitting: Nurse Practitioner

## 2020-10-17 ENCOUNTER — Encounter: Payer: Self-pay | Admitting: Nurse Practitioner

## 2020-10-17 DIAGNOSIS — U071 COVID-19: Secondary | ICD-10-CM

## 2020-10-17 DIAGNOSIS — K760 Fatty (change of) liver, not elsewhere classified: Secondary | ICD-10-CM | POA: Insufficient documentation

## 2020-10-17 MED ORDER — BENZONATATE 200 MG PO CAPS: 200.0000 mg | ORAL_CAPSULE | Freq: Two times a day (BID) | ORAL | 0 refills | Status: AC | PRN

## 2020-10-17 MED ORDER — HYDROCOD POLST-CPM POLST ER 10-8 MG/5ML PO SUER: 5.0000 mL | Freq: Two times a day (BID) | ORAL | 0 refills | Status: DC | PRN

## 2020-10-17 NOTE — Progress Notes (Addendum)
Virtual Visit Progress Note  Ms. Deborah [name],you are scheduled for a virtual visit with your provider today.    Just as we do with appointments in the office, we must obtain your consent to participate.  Your consent will be active for this visit and any virtual visit you may have with one of our providers in the next 365 days.    If you have a MyChart account, I can also send a copy of this consent to you electronically.  All virtual visits are billed to your insurance company just like a traditional visit in the office.  As this is a virtual visit, video technology does not allow for your provider to perform a traditional examination.  This may limit your provider's ability to fully assess your condition.  If your provider identifies any concerns that need to be evaluated in person or the need to arrange testing such as labs, EKG, etc, we will make arrangements to do so.    Although advances in technology are sophisticated, we cannot ensure that it will always work on either your end or our end.  If the connection with a video visit is poor, we may have to switch to a telephone visit.  With either a video or telephone visit, we are not always able to ensure that we have a secure connection.   I need to obtain your verbal consent now.   Are you willing to proceed with your visit today?   Deborah [name] has provided verbal consent on 10/17/2020 for a virtual visit (video or telephone).   Alinda Dooms, NP 10/17/2020  11:19 AM    I connected with Deborah [name] on 10/17/20 at 11:30 AM EST by video enabled telemedicine visit and verified that I am speaking with the correct person using two identifiers.   I discussed the limitations, risks, security and privacy concerns of performing an evaluation and management service by telemedicine and the availability of in-person appointments. I also discussed with the patient that there may be a patient responsible charge related to this service. The patient  expressed understanding and agreed to proceed.   Other persons participating in the visit and their role in the encounter: none  Patient's location: home Provider's location: home  Chief Complaint: Covid Infection    Patient Care Team: Benita Stabile, MD as PCP - General (Internal Medicine) Wendall Stade, MD as PCP - Cardiology (Cardiology)   Name of the patient: Deborah [name]  [id number]  12-Oct-1965   Date of visit: 10/17/20  History of Presenting Illness- Patient is 55 year old female who presents for covid infection. Symptom onset on Thursday. Home test was negative. Got pcr on Friday which was positive. Coughing, productive. Not sleeping due to cough and congestion. Using dayquil and nyquil. Using tussin/robitussin. Some shortness of breath on exertion. Vaccinated but no booster. Cough is most bothersome. Tired.   Review of systems- Review of Systems  Constitutional: Positive for malaise/fatigue. Negative for chills, diaphoresis, fever and weight loss.  HENT: Negative for congestion, ear discharge, ear pain, nosebleeds, sinus pain and sore throat.   Eyes: Negative for pain and redness.  Respiratory: Positive for cough. Negative for hemoptysis, sputum production, shortness of breath, wheezing and stridor.   Cardiovascular: Negative for chest pain, palpitations and leg swelling.  Gastrointestinal: Negative for abdominal pain, constipation, diarrhea, nausea and vomiting.  Musculoskeletal: Negative for falls, joint pain and myalgias.  Skin: Negative for rash.  Neurological: Negative for dizziness, loss of consciousness,  weakness and headaches.  Psychiatric/Behavioral: Negative for depression. The patient is not nervous/anxious and does not have insomnia.   All other systems reviewed and are negative.    No Known Allergies  Past Medical History:  Diagnosis Date  . Anxiety   . Migraine     Past Surgical History:  Procedure Laterality Date  . right foot surgery  10/2017     Social History   Socioeconomic History  . Marital status: Single    Spouse name: Not on file  . Number of children: Not on file  . Years of education: Not on file  . Highest education level: Not on file  Occupational History  . Not on file  Tobacco Use  . Smoking status: Never Smoker  . Smokeless tobacco: Never Used  Substance and Sexual Activity  . Alcohol use: No  . Drug use: Never  . Sexual activity: Not on file  Other Topics Concern  . Not on file  Social History Narrative  . Not on file   Social Determinants of Health   Financial Resource Strain: Not on file  Food Insecurity: Not on file  Transportation Needs: Not on file  Physical Activity: Not on file  Stress: Not on file  Social Connections: Not on file  Intimate Partner Violence: Not on file    There is no immunization history on file for this patient.  Family History  Problem Relation Age of Onset  . Diabetes Mother   . Transient ischemic attack Mother   . Dementia Mother   . High blood pressure Father   . Heart attack Brother      Current Outpatient Medications:  .  clonazePAM (KLONOPIN) 0.5 MG tablet, TK 1/2 TO 1 T PO BID PRF ANXIETY, Disp: , Rfl:  .  escitalopram (LEXAPRO) 10 MG tablet, TK 1 T PO QD, Disp: , Rfl:   Physical exam: Exam limited due to telemedicine Physical Exam Constitutional:      General: She is not in acute distress. HENT:     Head: Normocephalic.  Pulmonary:     Effort: No respiratory distress.     Comments: No obvious shortness of breath. Speaking in full sentences. No coughing heard.  Neurological:     Mental Status: She is alert and oriented to person, place, and time.  Psychiatric:        Mood and Affect: Mood normal.        Behavior: Behavior normal.       Assessment and plan- Patient is a 56 y.o. female who presents for covid 19 infection:   1) Chest congestion  - Flonase or nasocort as needed twice a day. - Afrin for nasal congestion no longer than 5  days.   2) Covid-19 Virus Infection - We discussed the cornerstones of covid treatment in outpatient setting including antivirals, antibodies, and supportive care. She has been vaccinated but not had a booster. We discussed referral for consideration of monoclonal antibodies though supply is limited which she declines. We discussed molnupiravir, [employer] medication for which she is a good candidate but she declined. Therefore we discussed supportive care:  - I advised patient to stay well hydrated, particularly in the event of sustained or high fevers, in whom insensible fluid losses may be significant  - Cough that is persistent, interferes with sleep, or causes discomfort can be managed with an over-the-counter cough medication (eg, dextromethorphan) or prescription benzonatate, 100 to 200 mg orally three times daily as needed. - Will also send in  prescription cough syrup, Tussionex which does have codeine. Discussed risks and benefits of opioid therapy. Advised not to drive. Risks of dependency reviewed and therefore will only use for short period of time and to only use if symptoms unrelieved by other options.  - I recommend rest as needed during the acute illness and frequent repositioning ambulation is encouraged. In addition, we encourage all patients to advance activity as soon as tolerated during recovery. - take mucinex to thin secretions per label - take tylenol and/or ibuprofen per label as needed for pain/fever/ body aches. Do not exceed 3000 mg in 24 hour period of tylenol - Reviewed symptoms that would warrant ER or hospital care - encouraged patient to monitor oxygen levels with pulse oximeter, reviewed troubleshooting, and to seek ER for SpO2 < 94% - CDC criteria for quarantine and isolation reviewed - In non-hospitalized patients, we do not treat COVID-19 with dexamethasone, prednisone, or other corticosteroids. Per the literature, extrapolating from the results of studies of hospitalized  patients, there is no evidence that corticosteroids benefit patients without a supplemental oxygen requirement, and further, they may be associated with poorer clinical outcomes - There is not high-quality data that suggests that supplementation with vitamin C, vitamin D, or zinc reduces the severity of COVID-19 in non-hospitalized patients - I do recommend breathing exercises though they haven't been studied in patients with covid-19.  - Some patients with cough or dyspnea may experience symptomatic improvement with self-proning (resting in the prone rather than the supine position) - For patients with documented COVID-19, we do not routinely administer empiric therapy for bacterial pneumonia. Data are limited, but bacterial superinfection does not appear to be a prominent feature of COVID-19  Return to clinic if symptoms do not improve or worsen   Visit Diagnosis No diagnosis found.  Patient expressed understanding and was in agreement with this plan. She also understands that She can call clinic at any time with any questions, concerns, or complaints.   I discussed the assessment and treatment plan with the patient. The patient was provided an opportunity to ask questions and all were answered. The patient agreed with the plan and demonstrated an understanding of the instructions.   The patient was advised to call back or seek an in-person evaluation if the symptoms worsen or if the condition fails to improve as anticipated.   I spent 30 minutes face-to-face video visit time dedicated to the care of this patient on the date of this encounter to include pre-visit review of recent medical records and medications, problem list, comorbidities, face-to-face time with the patient, and post visit ordering of testing/documentation.    Thank you for allowing me to participate in the care of this very pleasant patient.   Consuello Masse, DNP, AGNP-C [facility]   Addendum:  10/18/20 12:30 pm- tussionex not covered by insurance. Prescription sent for cheratussin. Send goodrx pricing to patient for financial assistance.

## 2020-10-17 NOTE — Patient Instructions (Addendum)

## 2020-10-18 MED ORDER — GUAIFENESIN-CODEINE 100-10 MG/5ML PO SYRP: 5.0000 mL | ORAL_SOLUTION | Freq: Three times a day (TID) | ORAL | 0 refills | Status: AC | PRN

## 2020-10-18 NOTE — Addendum Note (Signed)
Addended by: Alinda Dooms on: 10/18/2020 12:39 PM   Modules accepted: Orders

## 2020-10-22 ENCOUNTER — Telehealth: Payer: BC Managed Care – PPO | Admitting: Family Medicine

## 2020-10-22 ENCOUNTER — Encounter: Payer: Self-pay | Admitting: Family Medicine

## 2020-10-22 DIAGNOSIS — R112 Nausea with vomiting, unspecified: Secondary | ICD-10-CM

## 2020-10-22 DIAGNOSIS — U071 COVID-19: Secondary | ICD-10-CM

## 2020-10-22 MED ORDER — ONDANSETRON HCL 4 MG PO TABS: 4.0000 mg | ORAL_TABLET | Freq: Three times a day (TID) | ORAL | 0 refills | Status: AC | PRN

## 2020-10-22 NOTE — Patient Instructions (Signed)
I appreciate the opportunity to provide you with care for your health and wellness.  Follow up: as needed  Some patients get GI upset with COVID. I have sent in Zofran to help with nausea and vomiting. Please hydrate well and as tolerated.  Focus on a bland diet for the next 2-3 days to give your gut rest.  Please continue to practice social distancing to keep you, your family, and our community safe.  If you must go out, please wear a mask and practice good handwashing.   Bland Diet A bland diet consists of foods that are often soft and do not have a lot of fat, fiber, or extra seasonings. Foods without fat, fiber, or seasoning are easier for the body to digest. They are also less likely to irritate your mouth, throat, stomach, and other parts of your digestive system. A bland diet is sometimes called a BRAT diet. What is my plan? Your health care provider or food and nutrition specialist (dietitian) may recommend specific changes to your diet to prevent symptoms or to treat your symptoms. These changes may include:  Eating small meals often.  Cooking food until it is soft enough to chew easily.  Chewing your food well.  Drinking fluids slowly.  Not eating foods that are very spicy, sour, or fatty.  Not eating citrus fruits, such as oranges and grapefruit. What do I need to know about this diet?  Eat a variety of foods from the bland diet food list.  Do not follow a bland diet longer than needed.  Ask your health care provider whether you should take vitamins or supplements. What foods can I eat? Grains Hot cereals, such as cream of wheat. Rice. Bread, crackers, or tortillas made from refined white flour.   Vegetables Canned or cooked vegetables. Mashed or boiled potatoes. Fruits Bananas. Applesauce. Other types of cooked or canned fruit with the skin and seeds removed, such as canned peaches or pears.   Meats and other proteins Scrambled eggs. Creamy peanut butter or  other nut butters. Lean, well-cooked meats, such as chicken or fish. Tofu. Soups or broths.   Dairy Low-fat dairy products, such as milk, cottage cheese, or yogurt. Beverages Water. Herbal tea. Apple juice.   Fats and oils Mild salad dressings. Canola or olive oil. Sweets and desserts Pudding. Custard. Fruit gelatin. Ice cream. The items listed above may not be a complete list of recommended foods and beverages. Contact a dietitian for more options. What foods are not recommended? Grains Whole grain breads and cereals. Vegetables Raw vegetables. Fruits Raw fruits, especially citrus, berries, or dried fruits. Dairy Whole fat dairy foods. Beverages Caffeinated drinks. Alcohol. Seasonings and condiments Strongly flavored seasonings or condiments. Hot sauce. Salsa. Other foods Spicy foods. Fried foods. Sour foods, such as pickled or fermented foods. Foods with high sugar content. Foods high in fiber. The items listed above may not be a complete list of foods and beverages to avoid. Contact a dietitian for more information. Summary  A bland diet consists of foods that are often soft and do not have a lot of fat, fiber, or extra seasonings.  Foods without fat, fiber, or seasoning are easier for the body to digest.  Check with your health care provider to see how long you should follow this diet plan. It is not meant to be followed for long periods. This information is not intended to replace advice given to you by your health care provider. Make sure you discuss any questions you  have with your health care provider. Document Revised: 09/26/2017 Document Reviewed: 09/26/2017 Elsevier Patient Education  2021 [employer].

## 2020-10-22 NOTE — Progress Notes (Signed)
[name], no are scheduled for a virtual visit with your provider today.    Just as we do with appointments in the office, we must obtain your consent to participate.  Your consent will be active for this visit and any virtual visit you may have with one of our providers in the next 365 days.    If you have a MyChart account, I can also send a copy of this consent to you electronically.  All virtual visits are billed to your insurance company just like a traditional visit in the office.  As this is a virtual visit, video technology does not allow for your provider to perform a traditional examination.  This may limit your provider's ability to fully assess your condition.  If your provider identifies any concerns that need to be evaluated in person or the need to arrange testing such as labs, EKG, etc, we will make arrangements to do so.    Although advances in technology are sophisticated, we cannot ensure that it will always work on either your end or our end.  If the connection with a video visit is poor, we may have to switch to a telephone visit.  With either a video or telephone visit, we are not always able to ensure that we have a secure connection.   I need to obtain your verbal consent now.   Are you willing to proceed with your visit today?   [name] has provided verbal consent on 10/22/2020 for a virtual visit (video or telephone).   Freddy Finner, NP 10/22/2020  10:32 AM   Date:  10/22/2020   ID:  Deborah [name], DOB [birth year], MRN [id number]  Patient Location: Home Provider Location: Home Office   Participants: Patient and Provider for Visit and Wrap up  Method of visit: Video  Location of Patient: Home Location of Provider: Home Office Consent was obtain for visit over the video. Services rendered by provider: Visit was performed via video  A video enabled telemedicine application was used and I verified that I am speaking with the correct person using two  identifiers.  PCP:  Benita Stabile, MD   Chief Complaint:  Nausea   History of Present Illness:    Deborah [name] is a 55 y.o. female with history as stated below. Presents video telehealth for an acute care visit for new onset nausea. Was Covid positive last Friday. Cough was worse symptom. That has improved, but she reports not drinking fluids well or having appetite this week. And started having GI upset today. She reports diet recall of 24 hours which included a few ites of bacon and egg biscuit, some sweet potato, several bites of steak, and limited fluid yesterday. She has had diarrhea off and on since getting sick. Took antidiarrheal last night. Just started with the nausea and vomiting this morning, but reports one episode of vomit last week with after testing +.  She was feeling well but weak on Wednesday and yesterday felt the best she had all week. Her biggest concern is the nausea and not being able to eat or drink well.   Past Medical, Surgical, Social History, Allergies, and Medications have been Reviewed.  Past Medical History:  Diagnosis Date  . Anxiety   . Migraine     Current Meds  Medication Sig  . ondansetron (ZOFRAN) 4 MG tablet Take 1 tablet (4 mg total) by mouth every 8 (eight) hours as needed for nausea or vomiting.  Allergies:   Patient has no known allergies.   ROS See HPI for history of present illness.  Physical Exam Constitutional:      Appearance: Normal appearance.  HENT:     Head: Normocephalic and atraumatic.     Right Ear: External ear normal.     Left Ear: External ear normal.     Nose: Nose normal.  Eyes:     Extraocular Movements: Extraocular movements intact.     Conjunctiva/sclera: Conjunctivae normal.     Pupils: Pupils are equal, round, and reactive to light.  Pulmonary:     Comments: No shortness of breath in conversation  No cough noted  Musculoskeletal:        General: Normal range of motion.     Cervical back: Normal range  of motion.  Skin:    Coloration: Skin is pale. Skin is not jaundiced.  Neurological:     Mental Status: She is alert and oriented to person, place, and time.  Psychiatric:        Mood and Affect: Mood normal.        Behavior: Behavior normal.        Thought Content: Thought content normal.        Judgment: Judgment normal.               1. Non-intractable vomiting with nausea, unspecified vomiting type - + COVID last Friday -overall feeling better, appears to have residual GI upset -Zofran provided with education hydration and OTC measures I.e bland diet  -pt verbalized understanding   - ondansetron (ZOFRAN) 4 MG tablet; Take 1 tablet (4 mg total) by mouth every 8 (eight) hours as needed for nausea or vomiting.  Dispense: 15 tablet; Refill: 0  2. COVID-19 virus infection -Covid + last Friday -overall feeling better, weak a bit, having residual GI upset -zofran provided with education on hydration and rest.  - ondansetron (ZOFRAN) 4 MG tablet; Take 1 tablet (4 mg total) by mouth every 8 (eight) hours as needed for nausea or vomiting.  Dispense: 15 tablet; Refill: 0    Time:   Today, I have spent 15 minutes with the patient with telehealth technology discussing the above problems, reviewing the chart, previous notes, medications and orders.    Tests Ordered: No orders of the defined types were placed in this encounter.   Medication Changes: Meds ordered this encounter  Medications  . ondansetron (ZOFRAN) 4 MG tablet    Sig: Take 1 tablet (4 mg total) by mouth every 8 (eight) hours as needed for nausea or vomiting.    Dispense:  15 tablet    Refill:  0    Order Specific Question:   Supervising Provider    Answer:   Syliva Overman E [[id number]]     Disposition:  Follow up w pcp if needed or not improved Signed, Freddy Finner, NP  10/22/2020 10:32 AM

## 2020-10-25 ENCOUNTER — Emergency Department (HOSPITAL_COMMUNITY)
Admission: EM | Admit: 2020-10-25 | Discharge: 2020-10-25 | Disposition: A | Discharge status: Home / Self Care | Payer: BC Managed Care – PPO | Attending: Emergency Medicine | Admitting: Emergency Medicine

## 2020-10-25 ENCOUNTER — Encounter (HOSPITAL_COMMUNITY): Payer: Self-pay | Admitting: *Deleted

## 2020-10-25 ENCOUNTER — Emergency Department (HOSPITAL_COMMUNITY): Payer: BC Managed Care – PPO

## 2020-10-25 ENCOUNTER — Other Ambulatory Visit: Payer: Self-pay

## 2020-10-25 DIAGNOSIS — U071 COVID-19: Secondary | ICD-10-CM | POA: Diagnosis not present

## 2020-10-25 DIAGNOSIS — R112 Nausea with vomiting, unspecified: Secondary | ICD-10-CM | POA: Diagnosis not present

## 2020-10-25 DIAGNOSIS — R059 Cough, unspecified: Secondary | ICD-10-CM | POA: Diagnosis not present

## 2020-10-25 LAB — COMPREHENSIVE METABOLIC PANEL
ALT: 41 U/L (ref 0–44)
AST: 37 U/L (ref 15–41)
Albumin: 3.9 g/dL (ref 3.5–5.0)
Alkaline Phosphatase: 101 U/L (ref 38–126)
Anion gap: 8 (ref 5–15)
BUN: 11 mg/dL (ref 6–20)
CO2: 27 mmol/L (ref 22–32)
Calcium: 9.5 mg/dL (ref 8.9–10.3)
Chloride: 100 mmol/L (ref 98–111)
Creatinine, Ser: 0.75 mg/dL (ref 0.44–1.00)
GFR, Estimated: >60 mL/min (ref >60)
Glucose, Bld: 102 mg/dL — ABNORMAL HIGH (ref 70–99)
Potassium: 3.8 mmol/L (ref 3.5–5.1)
Sodium: 135 mmol/L (ref 135–145)
Total Bilirubin: 0.7 mg/dL (ref 0.3–1.2)
Total Protein: 7.9 g/dL (ref 6.5–8.1)

## 2020-10-25 LAB — URINALYSIS, ROUTINE W REFLEX MICROSCOPIC
Bilirubin Urine: NEGATIVE
Glucose, UA: NEGATIVE mg/dL
Hgb urine dipstick: NEGATIVE
Ketones, ur: NEGATIVE mg/dL
Leukocytes,Ua: NEGATIVE
Nitrite: NEGATIVE
Protein, ur: NEGATIVE mg/dL
Specific Gravity, Urine: 1.004 — ABNORMAL LOW (ref 1.005–1.030)
pH: 7 (ref 5.0–8.0)

## 2020-10-25 LAB — CBC
HCT: 46.1 % — ABNORMAL HIGH (ref 36.0–46.0)
Hemoglobin: 14.4 g/dL (ref 12.0–15.0)
MCH: 29.4 pg (ref 26.0–34.0)
MCHC: 31.2 g/dL (ref 30.0–36.0)
MCV: 94.3 fL (ref 80.0–100.0)
Platelets: 270 10*3/uL (ref 150–400)
RBC: 4.89 MIL/uL (ref 3.87–5.11)
RDW: 12.8 % (ref 11.5–15.5)
WBC: 9.1 10*3/uL (ref 4.0–10.5)
nRBC: 0 % (ref 0.0–0.2)

## 2020-10-25 LAB — LIPASE, BLOOD: Lipase: 22 U/L (ref 11–51)

## 2020-10-25 MED ORDER — MECLIZINE HCL 12.5 MG PO TABS: 12.5000 mg | ORAL_TABLET | Freq: Three times a day (TID) | ORAL | 0 refills | Status: AC | PRN

## 2020-10-25 MED ORDER — PROMETHAZINE HCL 25 MG PO TABS: 25.0000 mg | ORAL_TABLET | Freq: Four times a day (QID) | ORAL | 0 refills | Status: AC | PRN

## 2020-10-25 MED ORDER — SODIUM CHLORIDE 0.9 % IV BOLUS
1000.0000 mL | Freq: Once | INTRAVENOUS | Status: AC
  Administered 2020-10-25: 1000 mL via INTRAVENOUS

## 2020-10-25 MED ORDER — PROMETHAZINE HCL 25 MG/ML IJ SOLN
25.0000 mg | Freq: Once | INTRAMUSCULAR | Status: AC
  Administered 2020-10-25: 25 mg via INTRAVENOUS
  Filled 2020-10-25: qty 1

## 2020-10-25 MED ORDER — ONDANSETRON HCL 4 MG/2ML IJ SOLN
4.0000 mg | Freq: Once | INTRAMUSCULAR | Status: AC
  Administered 2020-10-25: 4 mg via INTRAVENOUS
  Filled 2020-10-25: qty 2

## 2020-10-25 NOTE — ED Triage Notes (Signed)
States she was advised by her GI doctor to come in for fluids due to vomiting

## 2020-10-25 NOTE — Discharge Instructions (Signed)
Take the Phenergan as prescribed for your nausea.  I have also written you for some meclizine should you have any vertigo type symptoms.  Make sure to try to increase fluids at home and rest.  Return for any worsening symptoms.  I have written you a work note should he want to stay home and rest.  You may return sooner if your symptoms are improved

## 2020-10-25 NOTE — ED Provider Notes (Signed)
[facility] EMERGENCY DEPARTMENT Provider Note   CSN: [id number] Arrival date & time: 10/25/20  1519     History Chief Complaint  Patient presents with  . Emesis    Deborah [name] is a 55 y.o. female with past medical history significant for migraines, nonalcoholic fatty liver disease who presents for evaluation of emesis.  Patient diagnosed with COVID approximately 9 days ago.  Had some emesis, diarrhea with her COVID.  Her diarrhea improved yesterday.  She is passing flatulence.  No associated abdominal pain.  Patient states she has continued to have multiple episodes of NBNB emesis, worsening over the last 3 days.  Was initially taking 4 mg Zofran, increased to 8 mg however still having persistent emesis.  She feels like her urine has been darker and she has not been urinating as much since her emesis began.  Does still have a mild cough and shortness of breath when she coughs however states this has improved since initial onset of her Covid symptoms.  No fever, chills, chest pain, hemoptysis, abdominal pain, dysuria, lateral leg swelling, redness or warmth.  Denies additional aggravating or alleviating factors.  History pain from patient and past medical records.  No interpreter is used.  [facility]-Dr. Marca Ancona  HPI     Past Medical History:  Diagnosis Date  . Anxiety   . Migraine     Patient Active Problem List   Diagnosis Date Noted  . NAFL (nonalcoholic fatty liver) 10/17/2020    Past Surgical History:  Procedure Laterality Date  . right foot surgery  10/2017     OB History   No obstetric history on file.     Family History  Problem Relation Age of Onset  . Diabetes Mother   . Transient ischemic attack Mother   . Dementia Mother   . High blood pressure Father   . Heart attack Brother     Social History   Tobacco Use  . Smoking status: Never Smoker  . Smokeless tobacco: Never Used  Substance Use Topics  . Alcohol use: No  . Drug use: Never    Home  Medications Prior to Admission medications   Medication Sig Start Date End Date Taking? Authorizing Provider  meclizine (ANTIVERT) 12.5 MG tablet Take 1 tablet (12.5 mg total) by mouth 3 (three) times daily as needed for dizziness. 10/25/20  Yes Wenceslao Loper A, PA-C  promethazine (PHENERGAN) 25 MG tablet Take 1 tablet (25 mg total) by mouth every 6 (six) hours as needed for nausea or vomiting. 10/25/20  Yes Fitz Matsuo A, PA-C  amoxicillin (AMOXIL) 875 MG tablet amoxicillin 875 mg tablet  TAKE 1 TABLET BY MOUTH TWICE DAILY FOR 7 DAYS    [provider]  benzonatate (TESSALON) 200 MG capsule Take 1 capsule (200 mg total) by mouth 2 (two) times daily as needed for cough. 10/17/20   Alinda Dooms, NP  clonazePAM (KLONOPIN) 0.5 MG tablet TK 1/2 TO 1 T PO BID PRF ANXIETY 01/31/19   [provider]  diclofenac (VOLTAREN) 50 MG EC tablet diclofenac sodium 50 mg tablet,delayed release    [provider]  escitalopram (LEXAPRO) 10 MG tablet TK 1 T PO QD 12/26/18   [provider]  guaiFENesin-codeine (ROBITUSSIN AC) 100-10 MG/5ML syrup Take 5 mLs by mouth 3 (three) times daily as needed for cough. May cause drowsiness. Do not drive with this medication. 10/18/20   Alinda Dooms, NP  ibuprofen (ADVIL) 600 MG tablet ibuprofen 600 mg tablet  TAKE 1 TABLET BY MOUTH EVERY 6 HOURS    [provider]  ondansetron (ZOFRAN) 4 MG tablet Take 1 tablet (4 mg total) by mouth every 8 (eight) hours as needed for nausea or vomiting. 10/22/20   Freddy Finner, NP  pantoprazole (PROTONIX) 40 MG tablet pantoprazole 40 mg tablet,delayed release  TAKE 1 TABLET BY MOUTH DAILY    [provider]    Allergies    Patient has no known allergies.  Review of Systems   Review of Systems  Constitutional: Negative.   HENT: Negative.   Respiratory: Positive for cough and shortness of breath (With cough, non at rest).   Cardiovascular: Negative.   Gastrointestinal:  Positive for diarrhea, nausea and vomiting. Negative for abdominal pain and rectal pain.  Genitourinary:       Dark urine  Musculoskeletal: Negative.   Skin: Negative.   Neurological: Positive for weakness (Generalized weakness).  All other systems reviewed and are negative.   Physical Exam Updated Vital Signs BP 117/88   Pulse 76   Temp 97.7 F (36.5 C) (Oral)   Resp 14   SpO2 100%   Physical Exam Vitals and nursing note reviewed.  Constitutional:      General: She is not in acute distress.    Appearance: She is well-developed and well-nourished. She is not ill-appearing, toxic-appearing or diaphoretic.  HENT:     Head: Normocephalic and atraumatic.     Nose: Nose normal.     Mouth/Throat:     Mouth: Mucous membranes are dry.  Eyes:     Pupils: Pupils are equal, round, and reactive to light.  Cardiovascular:     Rate and Rhythm: Normal rate.     Pulses: Normal pulses and intact distal pulses.          Radial pulses are 2+ on the right side and 2+ on the left side.     Heart sounds: Normal heart sounds.  Pulmonary:     Effort: Pulmonary effort is normal. No respiratory distress.     Breath sounds: Normal breath sounds.     Comments: Clear to auscultation bilaterally. Speaks in full sentences without difficulty Chest:     Comments: Equal rise and fall the chest wall Abdominal:     General: Bowel sounds are normal. There is no distension.     Palpations: Abdomen is soft.     Tenderness: There is no abdominal tenderness. There is no guarding or rebound.     Comments: Soft, non tender without rebound or guarding  Musculoskeletal:        General: Normal range of motion.     Cervical back: Normal range of motion.  Skin:    General: Skin is warm and dry.     Capillary Refill: Capillary refill takes less than 2 seconds.  Neurological:     General: No focal deficit present.     Mental Status: She is alert and oriented to person, place, and time.     Cranial Nerves:  Cranial nerves are intact.     Sensory: Sensation is intact.     Motor: Motor function is intact.     Gait: Gait is intact.     Comments: Cn 2-12 grossly intact  Psychiatric:        Mood and Affect: Mood and affect normal.     ED Results / Procedures / Treatments   Labs (all labs ordered are listed, but only abnormal results are displayed) Labs Reviewed  COMPREHENSIVE METABOLIC  PANEL - Abnormal; Notable for the following components:      Result Value   Glucose, Bld 102 (*)    All other components within normal limits  CBC - Abnormal; Notable for the following components:   HCT 46.1 (*)    All other components within normal limits  URINALYSIS, ROUTINE W REFLEX MICROSCOPIC - Abnormal; Notable for the following components:   Specific Gravity, Urine 1.004 (*)    All other components within normal limits  LIPASE, BLOOD    EKG None  Radiology DG Chest Portable 1 View  Result Date: 10/25/2020 CLINICAL DATA:  Cough. EXAM: PORTABLE CHEST 1 VIEW COMPARISON:  None. FINDINGS: The heart size and mediastinal contours are within normal limits. Both lungs are clear. The visualized skeletal structures are unremarkable. IMPRESSION: No active disease. Electronically Signed   By: Katherine Mantlehristopher  Green M.D.   On: 10/25/2020 16:54    Procedures Procedures   Medications Ordered in ED Medications  sodium chloride 0.9 % bolus 1,000 mL (0 mLs Intravenous Stopped 10/25/20 1801)  ondansetron (ZOFRAN) injection 4 mg (4 mg Intravenous Given 10/25/20 1653)  sodium chloride 0.9 % bolus 1,000 mL (0 mLs Intravenous Stopped 10/25/20 1950)  promethazine (PHENERGAN) injection 25 mg (25 mg Intravenous Given 10/25/20 1859)    ED Course  I have reviewed the triage vital signs and the nursing notes.  Pertinent labs & imaging results that were available during my care of the patient were reviewed by me and considered in my medical decision making (see chart for details).  55 year old, recent diagnosis of Covid  presents for evaluation of emesis.  Has tried Zofran at home without relief.  On arrival she is afebrile, nonseptic, non-ill-appearing.  Her heart and lungs are clear.  Her abdomen is soft, nontender.  Plan on labs, imaging and reassess.  Labs and imaging personally reviewed and interpreted:  CBC without leukocytosis Metabolic panel glucose 102, no additional electrolyte, renal or liver abnormality Lipase 22 UA negative for infection however does appear to be dehydrated DG chest without any significant abnormality  Patient reassessed.  Feels nauseous however no emesis.  Will give additional dose of antiemetic.  She has had 2 L of IV fluids thus far  Patient reassessed.  Has been able to tolerate p.o. intake with Phenergan significantly help with her emesis.  Will DC home with symptomatic management.  Does occasionally have some dizziness however nonfocal neuro exam without deficits.  Feel likely due to dehydration which should improve as she is tolerating p.o. intake.  Discussed meclizine as needed if she has any vertiginous type symptoms however none currently.  Low suspicion for acute central cause of the symptoms.  Reevaluation abdomen soft, nontender.  Low suspicion for acute intra-abdominal process  Patient does not meet the SIRS or Sepsis criteria.  On repeat exam patient does not have a surgical abdomin and there are no peritoneal signs.  No indication of appendicitis, bowel obstruction, bowel perforation, cholecystitis, diverticulitis, PID or ectopic pregnancy.  Emesis likely due to her Covid infection.  She has no obstructive symptoms.  Will DC home with symptomatic management.  She will follow-up with PCP.  She is agreeable for this  The patient has been appropriately medically screened and/or stabilized in the ED. I have low suspicion for any other emergent medical condition which would require further screening, evaluation or treatment in the ED or require inpatient management.  Patient  is hemodynamically stable and in no acute distress.  Patient able to ambulate in department prior  to ED.  Evaluation does not show acute pathology that would require ongoing or additional emergent interventions while in the emergency department or further inpatient treatment.  I have discussed the diagnosis with the patient and answered all questions.  Pain is been managed while in the emergency department and patient has no further complaints prior to discharge.  Patient is comfortable with plan discussed in room and is stable for discharge at this time.  I have discussed strict return precautions for returning to the emergency department.  Patient was encouraged to follow-up with PCP/specialist refer to at discharge.     MDM Rules/Calculators/A&P                          Deborah [name] was evaluated in Emergency Department on 10/25/2020 for the symptoms described in the history of present illness. She was evaluated in the context of the global COVID-19 pandemic, which necessitated consideration that the patient might be at risk for infection with the SARS-CoV-2 virus that causes COVID-19. Institutional protocols and algorithms that pertain to the evaluation of patients at risk for COVID-19 are in a state of rapid change based on information released by regulatory bodies including the CDC and federal and state organizations. These policies and algorithms were followed during the patient's care in the ED. Final Clinical Impression(s) / ED Diagnoses Final diagnoses:  COVID  Non-intractable vomiting with nausea, unspecified vomiting type    Rx / DC Orders ED Discharge Orders         Ordered    promethazine (PHENERGAN) 25 MG tablet  Every 6 hours PRN        10/25/20 2028    meclizine (ANTIVERT) 12.5 MG tablet  3 times daily PRN        10/25/20 2028           Carollyn Etcheverry A, PA-C 10/25/20 2244    Bethann Berkshire, MD 10/27/20 1101

## 2020-11-09 DIAGNOSIS — F331 Major depressive disorder, recurrent, moderate: Secondary | ICD-10-CM | POA: Diagnosis not present

## 2020-11-09 DIAGNOSIS — Z8616 Personal history of COVID-19: Secondary | ICD-10-CM | POA: Diagnosis not present

## 2020-11-09 DIAGNOSIS — Z136 Encounter for screening for cardiovascular disorders: Secondary | ICD-10-CM | POA: Diagnosis not present

## 2020-11-09 DIAGNOSIS — K760 Fatty (change of) liver, not elsewhere classified: Secondary | ICD-10-CM | POA: Diagnosis not present

## 2020-11-09 DIAGNOSIS — Z13220 Encounter for screening for lipoid disorders: Secondary | ICD-10-CM | POA: Diagnosis not present

## 2020-11-09 DIAGNOSIS — R059 Cough, unspecified: Secondary | ICD-10-CM | POA: Diagnosis not present

## 2020-11-09 DIAGNOSIS — Z0000 Encounter for general adult medical examination without abnormal findings: Secondary | ICD-10-CM | POA: Diagnosis not present

## 2020-11-17 DIAGNOSIS — Z01419 Encounter for gynecological examination (general) (routine) without abnormal findings: Secondary | ICD-10-CM | POA: Diagnosis not present

## 2020-11-17 DIAGNOSIS — Z1231 Encounter for screening mammogram for malignant neoplasm of breast: Secondary | ICD-10-CM | POA: Diagnosis not present

## 2020-11-17 DIAGNOSIS — Z6841 Body Mass Index (BMI) 40.0 and over, adult: Secondary | ICD-10-CM | POA: Diagnosis not present

## 2020-11-25 DIAGNOSIS — L821 Other seborrheic keratosis: Secondary | ICD-10-CM | POA: Diagnosis not present

## 2021-01-25 ENCOUNTER — Other Ambulatory Visit: Payer: BC Managed Care – PPO

## 2021-01-25 ENCOUNTER — Other Ambulatory Visit: Payer: BC Managed Care – PPO | Attending: Critical Care Medicine

## 2021-01-25 DIAGNOSIS — Z20822 Contact with and (suspected) exposure to covid-19: Secondary | ICD-10-CM

## 2021-01-26 LAB — SARS-COV-2, NAA 2 DAY TAT

## 2021-01-26 LAB — NOVEL CORONAVIRUS, NAA: SARS-CoV-2, NAA: NOT DETECTED

## 2021-06-26 IMAGING — MG DIGITAL SCREENING BILATERAL MAMMOGRAM WITH TOMO AND CAD
6 series · 6 of 14 positions shown · non-contrast
Comparison: Previous exam(s).

CLINICAL DATA: Screening.

EXAM:
DIGITAL SCREENING BILATERAL MAMMOGRAM WITH TOMO AND CAD

[R MLO synth-2D]
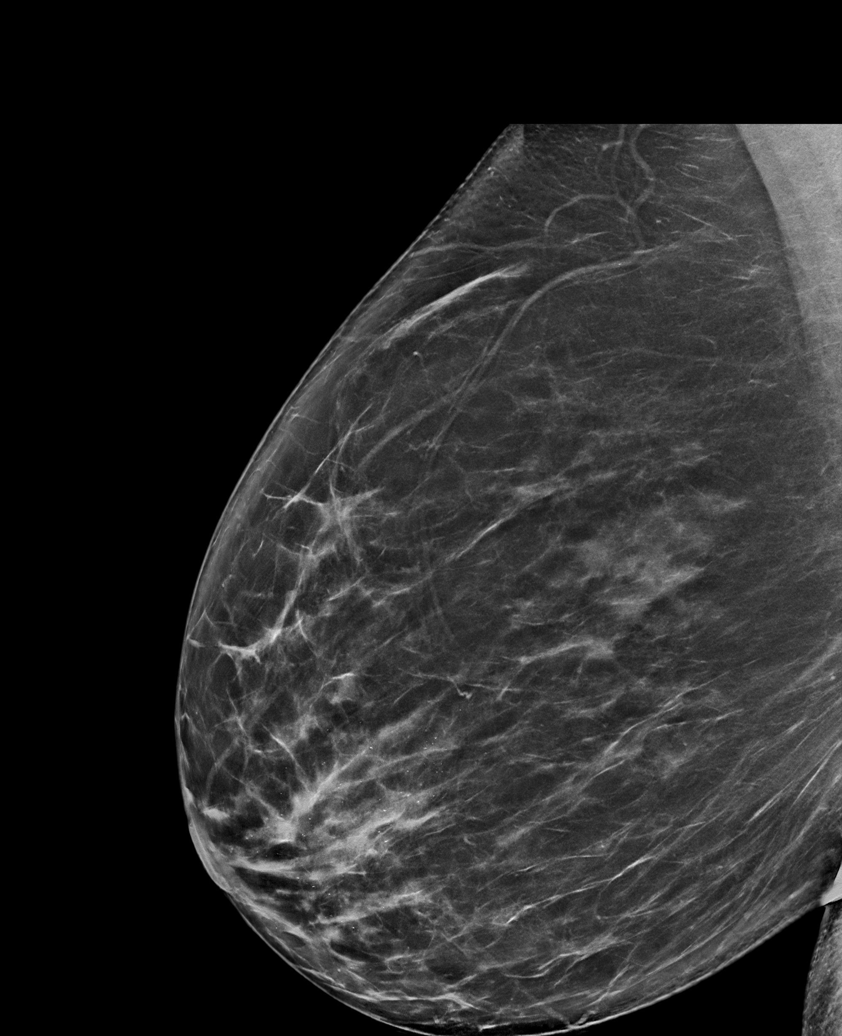

[L MLO synth-2D]
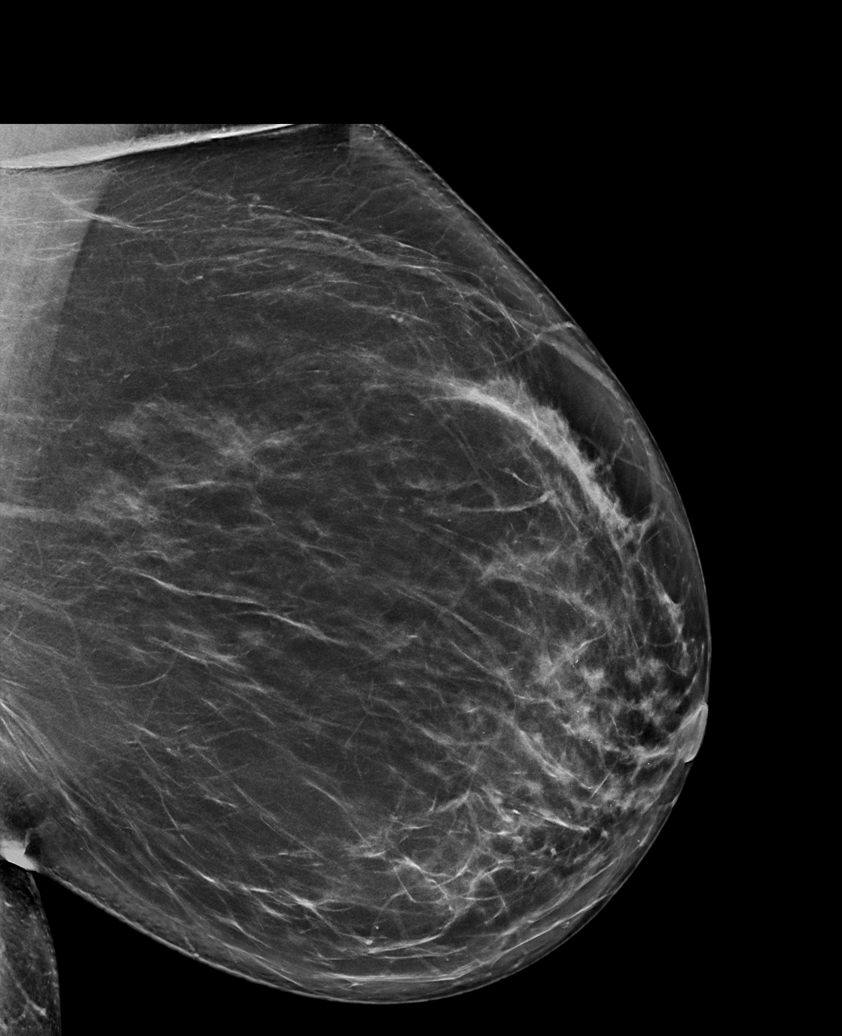

[R CC synth-2D]
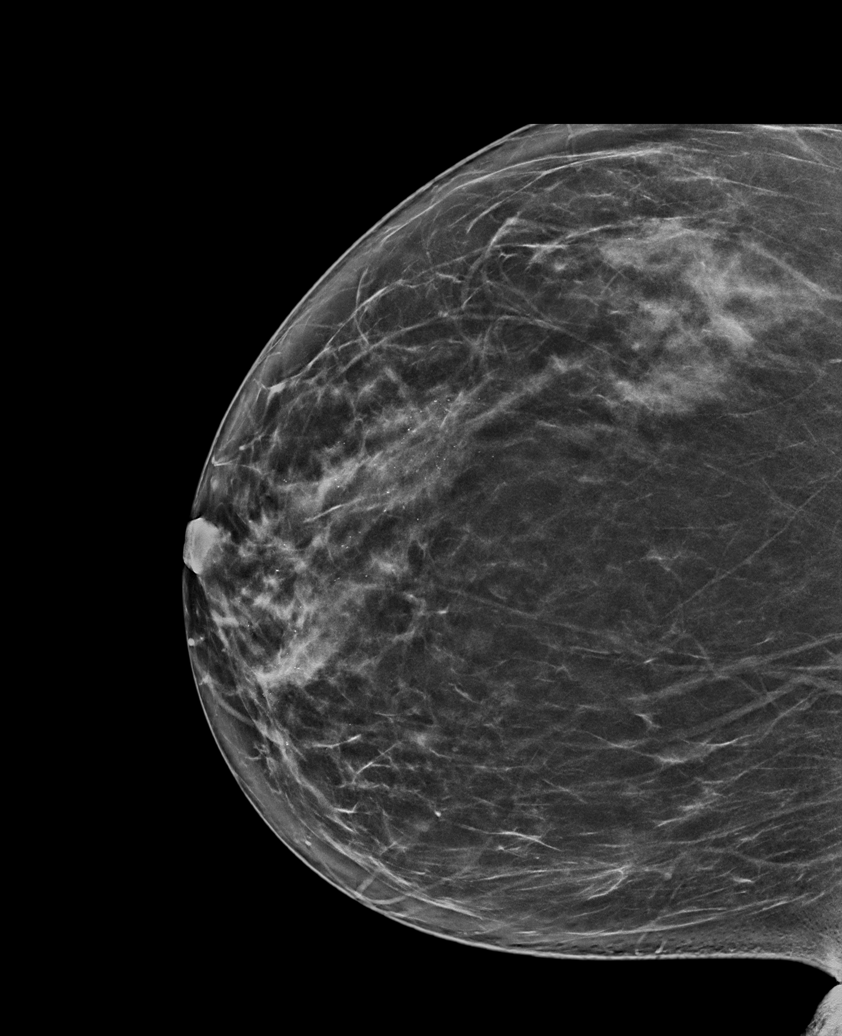

[L CC synth-2D]
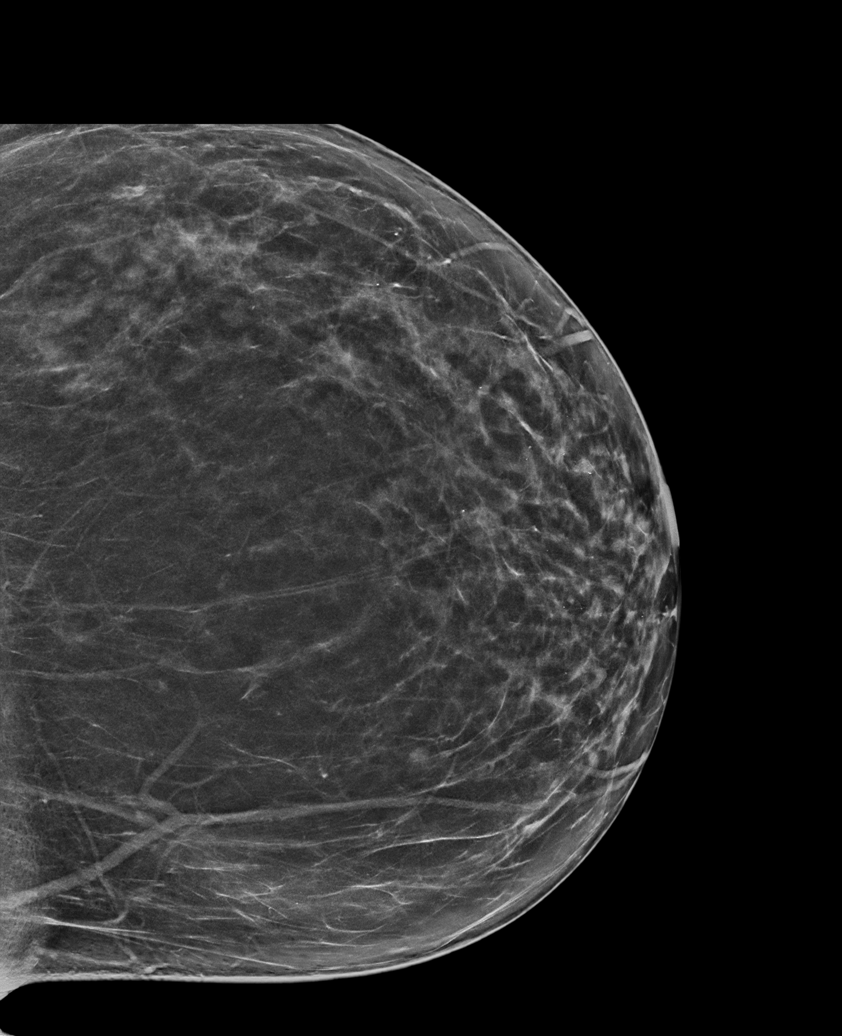

[R CC tomo · tomo slice 41/82.0]
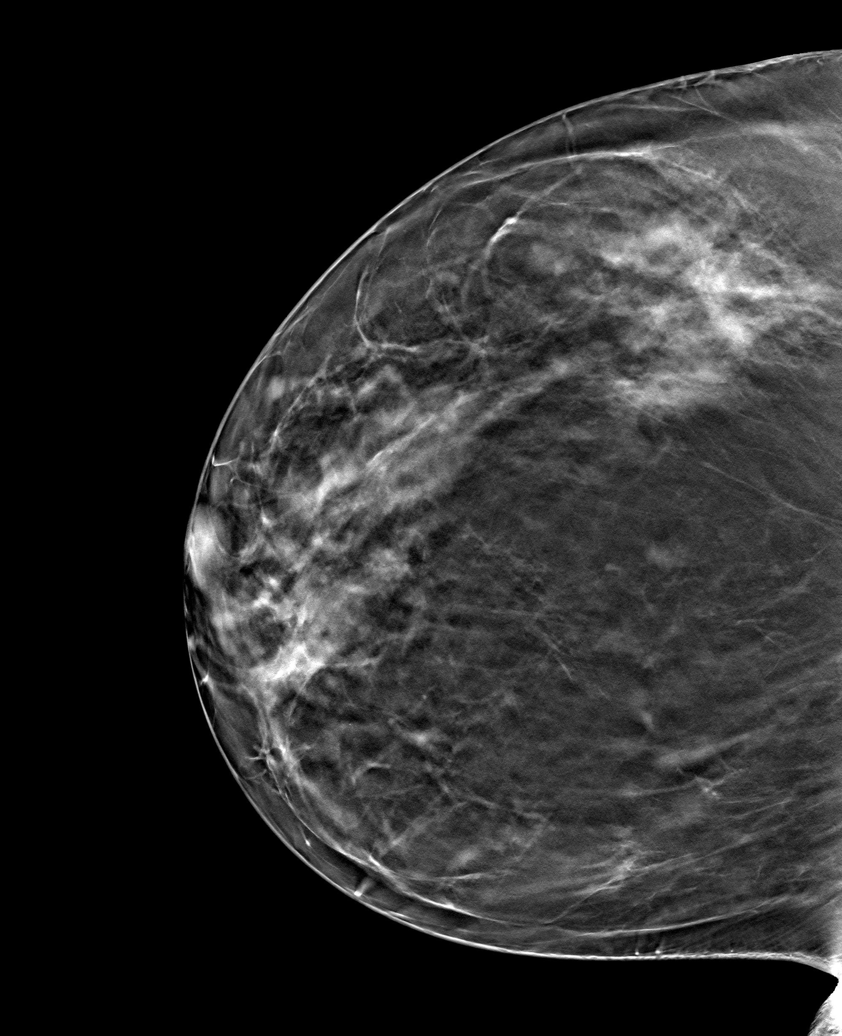

[L MLO tomo · tomo slice 47/94.0]
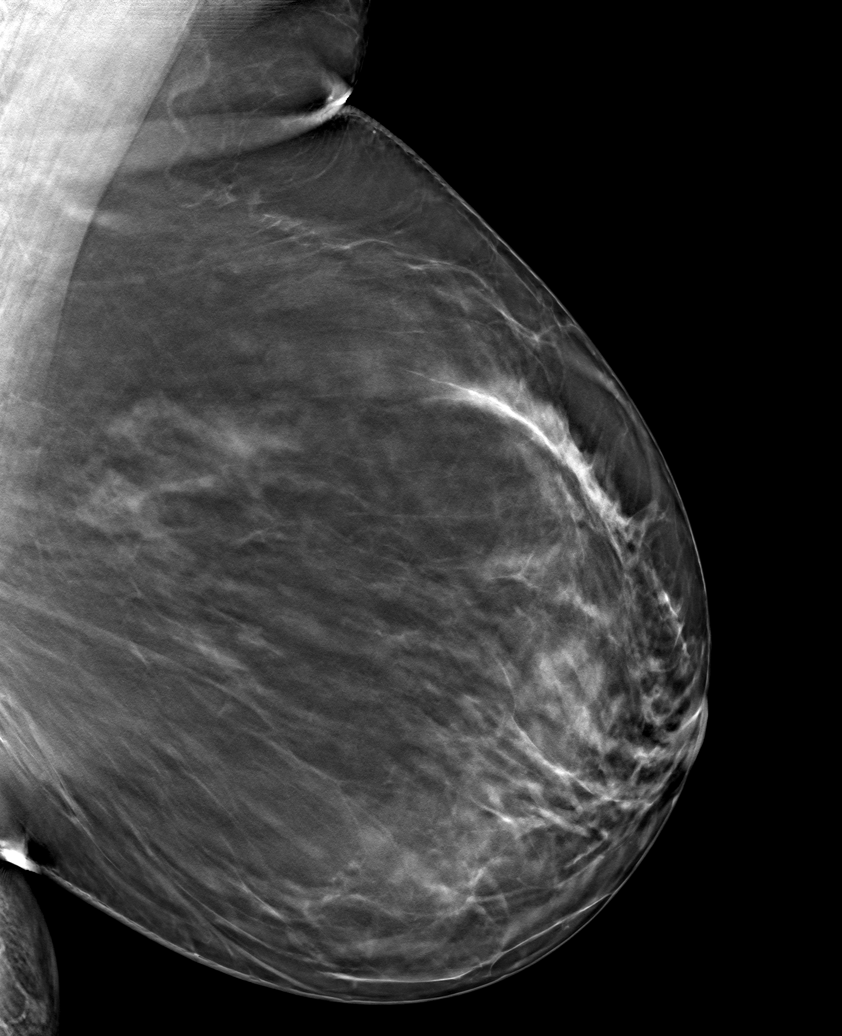

[6 of 14 positions shown; findings below may reference images not displayed]

ACR Breast Density Category b: There are scattered areas of
fibroglandular density.
FINDINGS: There are no findings suspicious for malignancy. Images were
processed with CAD.
IMPRESSION: No mammographic evidence of malignancy. A result letter of this
screening mammogram will be mailed directly to the patient.

RECOMMENDATION:
Screening mammogram in one year. (Code:CN-[id number])

BI-RADS CATEGORY  1: Negative.

## 2021-10-04 DIAGNOSIS — Z0000 Encounter for general adult medical examination without abnormal findings: Secondary | ICD-10-CM | POA: Diagnosis not present

## 2021-10-04 DIAGNOSIS — R7301 Impaired fasting glucose: Secondary | ICD-10-CM | POA: Diagnosis not present

## 2021-10-04 DIAGNOSIS — E559 Vitamin D deficiency, unspecified: Secondary | ICD-10-CM | POA: Diagnosis not present

## 2021-10-04 DIAGNOSIS — E782 Mixed hyperlipidemia: Secondary | ICD-10-CM | POA: Diagnosis not present

## 2021-11-03 DIAGNOSIS — G43009 Migraine without aura, not intractable, without status migrainosus: Secondary | ICD-10-CM | POA: Diagnosis not present

## 2022-01-03 DIAGNOSIS — Z1231 Encounter for screening mammogram for malignant neoplasm of breast: Secondary | ICD-10-CM | POA: Diagnosis not present

## 2022-01-06 DIAGNOSIS — K760 Fatty (change of) liver, not elsewhere classified: Secondary | ICD-10-CM | POA: Diagnosis not present

## 2022-01-06 DIAGNOSIS — E875 Hyperkalemia: Secondary | ICD-10-CM | POA: Diagnosis not present

## 2022-01-06 DIAGNOSIS — Z0000 Encounter for general adult medical examination without abnormal findings: Secondary | ICD-10-CM | POA: Diagnosis not present

## 2022-01-06 DIAGNOSIS — Z23 Encounter for immunization: Secondary | ICD-10-CM | POA: Diagnosis not present

## 2022-01-06 DIAGNOSIS — E559 Vitamin D deficiency, unspecified: Secondary | ICD-10-CM | POA: Diagnosis not present

## 2022-02-07 DIAGNOSIS — Z124 Encounter for screening for malignant neoplasm of cervix: Secondary | ICD-10-CM | POA: Diagnosis not present

## 2022-02-07 DIAGNOSIS — Z1151 Encounter for screening for human papillomavirus (HPV): Secondary | ICD-10-CM | POA: Diagnosis not present

## 2022-02-07 DIAGNOSIS — Z6841 Body Mass Index (BMI) 40.0 and over, adult: Secondary | ICD-10-CM | POA: Diagnosis not present

## 2022-02-07 DIAGNOSIS — Z01419 Encounter for gynecological examination (general) (routine) without abnormal findings: Secondary | ICD-10-CM | POA: Diagnosis not present

## 2022-02-09 ENCOUNTER — Other Ambulatory Visit: Payer: Self-pay | Admitting: Obstetrics and Gynecology

## 2022-02-09 DIAGNOSIS — Z8249 Family history of ischemic heart disease and other diseases of the circulatory system: Secondary | ICD-10-CM

## 2022-03-13 DIAGNOSIS — Z23 Encounter for immunization: Secondary | ICD-10-CM | POA: Diagnosis not present

## 2022-03-24 ENCOUNTER — Other Ambulatory Visit: Payer: BC Managed Care – PPO

## 2022-03-31 ENCOUNTER — Ambulatory Visit
Admission: RE | Admit: 2022-03-31 | Discharge: 2022-03-31 | Disposition: A | Discharge status: Home / Self Care | Payer: No Typology Code available for payment source | Source: Ambulatory Visit | Attending: Obstetrics and Gynecology | Admitting: Obstetrics and Gynecology

## 2022-03-31 DIAGNOSIS — Z8249 Family history of ischemic heart disease and other diseases of the circulatory system: Secondary | ICD-10-CM

## 2022-05-26 DIAGNOSIS — Z79899 Other long term (current) drug therapy: Secondary | ICD-10-CM | POA: Diagnosis not present

## 2022-05-26 DIAGNOSIS — R41840 Attention and concentration deficit: Secondary | ICD-10-CM | POA: Diagnosis not present

## 2022-05-26 DIAGNOSIS — F5081 Binge eating disorder: Secondary | ICD-10-CM | POA: Diagnosis not present

## 2022-09-12 DIAGNOSIS — N6310 Unspecified lump in the right breast, unspecified quadrant: Secondary | ICD-10-CM | POA: Diagnosis not present

## 2022-09-18 DIAGNOSIS — N6489 Other specified disorders of breast: Secondary | ICD-10-CM | POA: Diagnosis not present

## 2022-09-18 DIAGNOSIS — R928 Other abnormal and inconclusive findings on diagnostic imaging of breast: Secondary | ICD-10-CM | POA: Diagnosis not present

## 2022-10-17 DIAGNOSIS — J069 Acute upper respiratory infection, unspecified: Secondary | ICD-10-CM | POA: Diagnosis not present

## 2022-11-06 DIAGNOSIS — N6310 Unspecified lump in the right breast, unspecified quadrant: Secondary | ICD-10-CM | POA: Diagnosis not present

## 2022-12-22 DIAGNOSIS — R140 Abdominal distension (gaseous): Secondary | ICD-10-CM | POA: Diagnosis not present

## 2022-12-22 DIAGNOSIS — R142 Eructation: Secondary | ICD-10-CM | POA: Diagnosis not present

## 2022-12-22 DIAGNOSIS — R1084 Generalized abdominal pain: Secondary | ICD-10-CM | POA: Diagnosis not present

## 2022-12-22 DIAGNOSIS — R197 Diarrhea, unspecified: Secondary | ICD-10-CM | POA: Diagnosis not present

## 2023-02-12 DIAGNOSIS — Z1151 Encounter for screening for human papillomavirus (HPV): Secondary | ICD-10-CM | POA: Diagnosis not present

## 2023-02-12 DIAGNOSIS — Z1231 Encounter for screening mammogram for malignant neoplasm of breast: Secondary | ICD-10-CM | POA: Diagnosis not present

## 2023-02-12 DIAGNOSIS — Z01419 Encounter for gynecological examination (general) (routine) without abnormal findings: Secondary | ICD-10-CM | POA: Diagnosis not present

## 2023-02-12 DIAGNOSIS — Z6841 Body Mass Index (BMI) 40.0 and over, adult: Secondary | ICD-10-CM | POA: Diagnosis not present

## 2023-02-12 DIAGNOSIS — Z124 Encounter for screening for malignant neoplasm of cervix: Secondary | ICD-10-CM | POA: Diagnosis not present

## 2023-02-21 DIAGNOSIS — R8769 Abnormal cytological findings in specimens from other female genital organs: Secondary | ICD-10-CM | POA: Diagnosis not present

## 2023-02-21 DIAGNOSIS — N879 Dysplasia of cervix uteri, unspecified: Secondary | ICD-10-CM | POA: Diagnosis not present

## 2023-03-02 DIAGNOSIS — E559 Vitamin D deficiency, unspecified: Secondary | ICD-10-CM | POA: Diagnosis not present

## 2023-03-02 DIAGNOSIS — Z1330 Encounter for screening examination for mental health and behavioral disorders, unspecified: Secondary | ICD-10-CM | POA: Diagnosis not present

## 2023-03-02 DIAGNOSIS — R7301 Impaired fasting glucose: Secondary | ICD-10-CM | POA: Diagnosis not present

## 2023-03-02 DIAGNOSIS — Z0000 Encounter for general adult medical examination without abnormal findings: Secondary | ICD-10-CM | POA: Diagnosis not present

## 2023-03-02 DIAGNOSIS — E782 Mixed hyperlipidemia: Secondary | ICD-10-CM | POA: Diagnosis not present

## 2023-06-29 DIAGNOSIS — B9689 Other specified bacterial agents as the cause of diseases classified elsewhere: Secondary | ICD-10-CM | POA: Diagnosis not present

## 2023-06-29 DIAGNOSIS — J208 Acute bronchitis due to other specified organisms: Secondary | ICD-10-CM | POA: Diagnosis not present

## 2023-07-20 DIAGNOSIS — M25561 Pain in right knee: Secondary | ICD-10-CM | POA: Diagnosis not present

## 2023-07-21 DIAGNOSIS — M25562 Pain in left knee: Secondary | ICD-10-CM | POA: Diagnosis not present

## 2023-07-25 DIAGNOSIS — M25562 Pain in left knee: Secondary | ICD-10-CM | POA: Diagnosis not present

## 2023-08-17 DIAGNOSIS — M25562 Pain in left knee: Secondary | ICD-10-CM | POA: Diagnosis not present

## 2023-09-02 DIAGNOSIS — J020 Streptococcal pharyngitis: Secondary | ICD-10-CM | POA: Diagnosis not present

## 2023-09-02 DIAGNOSIS — J36 Peritonsillar abscess: Secondary | ICD-10-CM | POA: Diagnosis not present
